# Patient Record
Sex: Female | Born: 1960 | Race: White | Hispanic: No | Marital: Married | Smoking: Never smoker
Health system: Southern US, Community
[De-identification: ages and names within clinical notes are randomized; demographics above are authoritative.]

## PROBLEM LIST (undated history)

## (undated) DIAGNOSIS — C439 Malignant melanoma of skin, unspecified: Secondary | ICD-10-CM

## (undated) DIAGNOSIS — F329 Major depressive disorder, single episode, unspecified: Secondary | ICD-10-CM

## (undated) DIAGNOSIS — F32A Depression, unspecified: Secondary | ICD-10-CM

## (undated) HISTORY — DX: Depression, unspecified: F32.A

## (undated) HISTORY — PX: ABDOMINAL HYSTERECTOMY: SHX81

## (undated) HISTORY — DX: Major depressive disorder, single episode, unspecified: F32.9

## (undated) HISTORY — DX: Malignant melanoma of skin, unspecified: C43.9

---

## 2000-03-03 ENCOUNTER — Ambulatory Visit (HOSPITAL_BASED_OUTPATIENT_CLINIC_OR_DEPARTMENT_OTHER): Admission: RE | Admit: 2000-03-03 | Discharge: 2000-03-03 | Payer: Self-pay | Admitting: General Surgery

## 2001-02-18 ENCOUNTER — Encounter: Admission: RE | Admit: 2001-02-18 | Discharge: 2001-02-18 | Payer: Self-pay | Admitting: Dermatology

## 2012-05-03 ENCOUNTER — Telehealth: Payer: Self-pay | Admitting: Nurse Practitioner

## 2012-05-03 NOTE — Telephone Encounter (Signed)
Appt given

## 2012-05-05 ENCOUNTER — Encounter: Payer: Self-pay | Admitting: Family Medicine

## 2012-05-05 ENCOUNTER — Ambulatory Visit (INDEPENDENT_AMBULATORY_CARE_PROVIDER_SITE_OTHER): Payer: BC Managed Care – PPO | Admitting: Family Medicine

## 2012-05-05 VITALS — BP 113/74 | HR 67 | Temp 97.2°F | Ht 64.0 in | Wt 171.0 lb

## 2012-05-05 DIAGNOSIS — M7711 Lateral epicondylitis, right elbow: Secondary | ICD-10-CM

## 2012-05-05 DIAGNOSIS — M7710 Lateral epicondylitis, unspecified elbow: Secondary | ICD-10-CM

## 2012-05-05 NOTE — Progress Notes (Signed)
Patient ID: Rachel Page, female   DOB: February 19, 1960, 52 y.o.   MRN: [id number] SUBJECTIVE: HPI: Right elbow pain for 3 weeks. 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 Repetitive work at [employer] with her right arm. No paresthesias.   PMH/PSH: reviewed/updated in Epic  SH/FH: reviewed/updated in Epic  Allergies: reviewed/updated in Epic  Medications: reviewed/updated in Epic  Immunizations: reviewed/updated in Epic  ROS: As above in the HPI. All other systems are stable or negative.  OBJECTIVE: APPEARANCE: WF Patient in no acute distress.The patient appeared well nourished and normally developed. Acyanotic. Waist: VITAL SIGNS:BP 113/74  Pulse 67  Temp(Src) 97.2 F (36.2 C) (Oral)  Ht 5\' 4"  (1.626 m)  Wt 171 lb (77.565 kg)  BMI 29.34 kg/m2  LMP 05/05/1992   SKIN: warm and  Dry without overt rashes, tattoos and scars  HEAD and Neck: without JVD, Head and scalp: normal Eyes:No scleral icterus. Fundi normal, eye movements normal. Ears: Auricle normal, canal normal, Tympanic membranes normal, insufflation normal. Nose: normal Throat: normal Neck & thyroid: normal  EXTREMETIES: nonedematous.  MUSCULOSKELETAL:  Spine: normal Joints: intact. Right lateral epicondyle very tender to palpation. Pain on pronation and supination.  NEUROLOGIC: oriented to time,place and person; nonfocal.  ASSESSMENT: Tennis elbow syndrome, right  PLAN: No orders of the defined types were placed in this encounter.   No results found for this or any previous visit (from the past 24 hour(s)). Meds ordered this encounter  Medications  . fish oil-omega-3 fatty acids 1000 MG capsule    Sig: Take 2 g by mouth daily.  Marland Kitchen aspirin 81 MG tablet    Sig: Take 81 mg by mouth daily.  . Multiple Vitamin (MULTIVITAMIN) tablet    Sig: Take 1 tablet by mouth daily.  . Red Yeast Rice Extract (RED YEAST RICE PO)    Sig: Take by mouth.  . calcium citrate-vitamin D (CITRACAL+D)  315-200 MG-UNIT per tablet    Sig: Take 1 tablet by mouth 2 (two) times daily.  Marland Kitchen estrogens, conjugated, (PREMARIN) 0.3 MG tablet    Sig: Take 0.3 mg by mouth 2 (two) times a week. Take daily for 21 days then do not take for 7 days.   Trigger Point Injection/Right Lateral Epicondylar area injection.  Pre-operative diagnosis: Right lateral Epicondylitis  Post-operative diagnosis: Right lateral epicondylitis  After risks and benefits were explained including bleeding, infection, worsening of the pain, damage to the area being injected, weakness, allergic reaction to medications, vascular injection, and nerve damage, signed consent was obtained.  All questions were answered.    The area of the trigger point was identified over the lateral epicondyle and the skin prepped with betadine and with alcohol and the alcohol allowed to dry.  Next, a 25 gauge 1.5 inch needle was placed in the area of the trigger point. The skin was pinched outward to ensure deep insertion of steroid.  Once reproduction of the pain was elicited and negative aspiration confirmed, the trigger point was injected and the needle removed.    The patient did tolerate the procedure well and there were not complications.    Medication used: 0.5 cc of Kenalog40; 1 mL 1% Lidocaine.    Trigger points injected: 1    Trigger point(s) location(s):  right epicondyle  Call if any problems. A tennis elbow splint was applied. Ice every 2 hours. Rest elbow at work. RTC prn or call if not resolved in 4 weeks. Consider Orthopedics consultation if relapse  or not resolved.   Zebulon Gantt P. Modesto Charon, M.D.

## 2012-05-05 NOTE — Patient Instructions (Addendum)
Ice every 2 hours.  Wear the tennis elbow strap.  Call for orthopedic referral if not resolved in the next 4 weeks.  Tennis Elbow Your caregiver has diagnosed you with a condition often referred to as "tennis elbow." This results from small tears or soreness (inflammation) at the start (origin) of the extensor muscles of the forearm. Although the condition is often called tennis or golfer's elbow, it is caused by any repetitive action performed by your elbow. HOME CARE INSTRUCTIONS  If the condition has been short lived, rest may be the only treatment required. Using your opposite hand or arm to perform the task may help. Even changing your grip may help rest the extremity. These may even prevent the condition from recurring.  Longer standing problems, however, will often be relieved faster by:  Using anti-inflammatory agents.  Applying ice packs for 30 minutes at the end of the working day, at bed time, or when activities are finished.  Your caregiver may also have you wear a splint or sling. This will allow the inflamed tendon to heal. At times, steroid injections aided with a local anesthetic will be required along with splinting for 1 to 2 weeks. Two to three steroid injections will often solve the problem. In some long standing cases, the inflamed tendon does not respond to conservative (non-surgical) therapy. Then surgery may be required to repair it. MAKE SURE YOU:   Understand these instructions.  Will watch your condition.  Will get help right away if you are not doing well or get worse. Document Released: 12/30/2004 Document Revised: 03/24/2011 Document Reviewed: 08/18/2007 [facility] Patient Information 2013 [place], [place].

## 2012-10-08 ENCOUNTER — Telehealth: Payer: Self-pay | Admitting: Family Medicine

## 2012-10-08 NOTE — Telephone Encounter (Signed)
Pt having back pain, started at work yesterday after bending down Pt notified to contact [facility] and have them to contact us for appt

## 2012-10-08 NOTE — Telephone Encounter (Signed)
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

## 2012-12-16 ENCOUNTER — Ambulatory Visit (INDEPENDENT_AMBULATORY_CARE_PROVIDER_SITE_OTHER): Payer: BC Managed Care – PPO | Admitting: Family Medicine

## 2012-12-16 ENCOUNTER — Encounter: Payer: Self-pay | Admitting: Family Medicine

## 2012-12-16 VITALS — BP 135/89 | HR 89 | Temp 101.2°F | Ht 64.0 in | Wt 168.6 lb

## 2012-12-16 DIAGNOSIS — J029 Acute pharyngitis, unspecified: Secondary | ICD-10-CM

## 2012-12-16 LAB — POCT RAPID STREP A (OFFICE): Rapid Strep A Screen: POSITIVE — AB

## 2012-12-16 MED ORDER — AZITHROMYCIN 250 MG PO TABS: ORAL_TABLET | ORAL | Status: DC

## 2012-12-16 NOTE — Patient Instructions (Signed)

## 2012-12-16 NOTE — Progress Notes (Signed)
   Subjective:    Patient ID: Rachel Page, female    DOB: 02/06/1960, 52 y.o.   MRN: [id number]  HPI This 52 y.o. female presents for evaluation of sore throat and fever.   Review of Systems C/o sore throat and fever   No chest pain, SOB, HA, dizziness, vision change, N/V, diarrhea, constipation, dysuria, urinary urgency or frequency, myalgias, arthralgias or rash.  Objective:   Physical Exam  Vital signs noted  Well developed well nourished female.  HEENT - Head atraumatic Normocephalic                Eyes - PERRLA, Conjuctiva - clear Sclera- Clear EOMI                Ears - EAC's Wnl TM's Wnl Gross Hearing WNL                Throat - oropharanx injected Respiratory - Lungs CTA bilateral Cardiac - RRR S1 and S2 without murmur GI - Abdomen soft Nontender and bowel sounds active x 4 Extremities - No edema. Neuro - Grossly intact.  Results for orders placed in visit on 12/16/12  POCT RAPID STREP A (OFFICE)      Result Value Range   Rapid Strep A Screen Positive (*) Negative      Assessment & Plan:  Sore throat - Plan: POCT rapid strep A, azithromycin (ZITHROMAX) 250 MG tablet  Acute pharyngitis - Plan: azithromycin (ZITHROMAX) 250 MG tablet  WSWG's prn Push po fluids, rest, tylenol and motrin otc prn as directed for fever, arthralgias, and myalgias.  Follow up prn if sx's continue or persist.  Deatra Canter FNP

## 2013-10-28 ENCOUNTER — Ambulatory Visit (INDEPENDENT_AMBULATORY_CARE_PROVIDER_SITE_OTHER): Payer: BC Managed Care – PPO | Admitting: Family Medicine

## 2013-10-28 ENCOUNTER — Encounter: Payer: Self-pay | Admitting: Family Medicine

## 2013-10-28 ENCOUNTER — Encounter: Payer: Self-pay | Admitting: *Deleted

## 2013-10-28 VITALS — BP 136/88 | HR 100 | Temp 100.3°F | Ht 64.0 in | Wt 168.0 lb

## 2013-10-28 DIAGNOSIS — J029 Acute pharyngitis, unspecified: Secondary | ICD-10-CM

## 2013-10-28 LAB — POCT RAPID STREP A (OFFICE): Rapid Strep A Screen: NEGATIVE

## 2013-10-28 MED ORDER — ERYTHROMYCIN ETHYLSUCCINATE 400 MG PO TABS: 400.0000 mg | ORAL_TABLET | Freq: Four times a day (QID) | ORAL | Status: DC

## 2013-10-28 NOTE — Progress Notes (Signed)
   Subjective:    Patient ID: LOY LITTLE, female    DOB: [birth year], 53 y.o.   MRN: [id number]  HPI This 53 y.o. female presents for evaluation of sore throat.   Review of Systems No chest pain, SOB, HA, dizziness, vision change, N/V, diarrhea, constipation, dysuria, urinary urgency or frequency, myalgias, arthralgias or rash.     Objective:   Physical Exam Vital signs noted  Well developed well nourished female.  HEENT - Head atraumatic Normocephalic                Eyes - PERRLA, Conjuctiva - clear Sclera- Clear EOMI                Ears - EAC's Wnl TM's Wnl Gross Hearing WNL                Nose - Nares patent                 Throat - oropharanx injected Respiratory - Lungs CTA bilateral Cardiac - RRR S1 and S2 without murmur GI - Abdomen soft Nontender and bowel sounds active x 4 Extremities - No edema. Neuro - Grossly intact.       Assessment & Plan:  Acute pharyngitis, unspecified pharyngitis type - Plan: POCT rapid strep A, erythromycin ethylsuccinate (E.E.S. 400) 400 MG tablet  Lysbeth Penner FNP

## 2013-11-09 ENCOUNTER — Other Ambulatory Visit: Payer: Self-pay | Admitting: Family Medicine

## 2013-11-09 ENCOUNTER — Telehealth: Payer: Self-pay | Admitting: Family Medicine

## 2013-11-09 DIAGNOSIS — J029 Acute pharyngitis, unspecified: Secondary | ICD-10-CM

## 2013-11-09 MED ORDER — AZITHROMYCIN 250 MG PO TABS: ORAL_TABLET | ORAL | Status: DC

## 2013-11-09 NOTE — Addendum Note (Signed)
Addended by: Marin Olp on: 11/09/2013 07:02 PM   Modules accepted: Orders

## 2013-11-09 NOTE — Telephone Encounter (Signed)
Zigmund Daniel can you send in a ENT consult

## 2013-11-09 NOTE — Telephone Encounter (Signed)
Pt notified of referral  

## 2013-11-09 NOTE — Telephone Encounter (Signed)
zpk sent to pharm and what does she want referral for URI?

## 2013-11-09 NOTE — Telephone Encounter (Signed)
Patient keeps getting strep and wants to go to ENT really prefers to go on nov the 9th, 16th.17th,24th,25 those are the days she is off.

## 2015-04-16 DIAGNOSIS — Z6831 Body mass index (BMI) 31.0-31.9, adult: Secondary | ICD-10-CM | POA: Diagnosis not present

## 2015-04-16 DIAGNOSIS — Z1272 Encounter for screening for malignant neoplasm of vagina: Secondary | ICD-10-CM | POA: Diagnosis not present

## 2015-04-16 DIAGNOSIS — Z01419 Encounter for gynecological examination (general) (routine) without abnormal findings: Secondary | ICD-10-CM | POA: Diagnosis not present

## 2015-05-10 DIAGNOSIS — Z85828 Personal history of other malignant neoplasm of skin: Secondary | ICD-10-CM | POA: Diagnosis not present

## 2015-05-10 DIAGNOSIS — Z08 Encounter for follow-up examination after completed treatment for malignant neoplasm: Secondary | ICD-10-CM | POA: Diagnosis not present

## 2015-05-10 DIAGNOSIS — Z1283 Encounter for screening for malignant neoplasm of skin: Secondary | ICD-10-CM | POA: Diagnosis not present

## 2015-08-06 DIAGNOSIS — L723 Sebaceous cyst: Secondary | ICD-10-CM | POA: Diagnosis not present

## 2016-03-20 DIAGNOSIS — Z1231 Encounter for screening mammogram for malignant neoplasm of breast: Secondary | ICD-10-CM | POA: Diagnosis not present

## 2016-04-21 DIAGNOSIS — Z1272 Encounter for screening for malignant neoplasm of vagina: Secondary | ICD-10-CM | POA: Diagnosis not present

## 2016-04-21 DIAGNOSIS — Z01419 Encounter for gynecological examination (general) (routine) without abnormal findings: Secondary | ICD-10-CM | POA: Diagnosis not present

## 2016-04-21 DIAGNOSIS — N952 Postmenopausal atrophic vaginitis: Secondary | ICD-10-CM | POA: Diagnosis not present

## 2016-05-15 DIAGNOSIS — D225 Melanocytic nevi of trunk: Secondary | ICD-10-CM | POA: Diagnosis not present

## 2016-05-15 DIAGNOSIS — D485 Neoplasm of uncertain behavior of skin: Secondary | ICD-10-CM | POA: Diagnosis not present

## 2016-05-15 DIAGNOSIS — L820 Inflamed seborrheic keratosis: Secondary | ICD-10-CM | POA: Diagnosis not present

## 2016-05-15 DIAGNOSIS — Z08 Encounter for follow-up examination after completed treatment for malignant neoplasm: Secondary | ICD-10-CM | POA: Diagnosis not present

## 2016-05-15 DIAGNOSIS — L821 Other seborrheic keratosis: Secondary | ICD-10-CM | POA: Diagnosis not present

## 2016-05-15 DIAGNOSIS — Z85828 Personal history of other malignant neoplasm of skin: Secondary | ICD-10-CM | POA: Diagnosis not present

## 2016-09-03 ENCOUNTER — Ambulatory Visit (INDEPENDENT_AMBULATORY_CARE_PROVIDER_SITE_OTHER): Payer: BLUE CROSS/BLUE SHIELD | Admitting: Family Medicine

## 2016-09-03 ENCOUNTER — Encounter: Payer: Self-pay | Admitting: Family Medicine

## 2016-09-03 VITALS — BP 132/85 | HR 61 | Temp 99.3°F | Ht 64.0 in | Wt 184.0 lb

## 2016-09-03 DIAGNOSIS — M65312 Trigger thumb, left thumb: Secondary | ICD-10-CM

## 2016-09-03 MED ORDER — METHYLPREDNISOLONE ACETATE 80 MG/ML IJ SUSP
40.0000 mg | Freq: Once | INTRAMUSCULAR | Status: AC
  Administered 2016-09-03: 40 mg via INTRAMUSCULAR

## 2016-09-03 NOTE — Progress Notes (Signed)
BP 132/85   Pulse 61   Temp 99.3 F (37.4 C) (Oral)   Ht 5\' 4"  (1.626 m)   Wt 184 lb (83.5 kg)   LMP 05/05/1992   BMI 31.58 kg/m    Subjective:    Patient ID: Rachel Page, female    DOB: [birth year], 56 y.o.   MRN: [id number]  HPI: Rachel Page is a 56 y.o. female presenting on 09/03/2016 for Thumb pain (left thumb, ongoing for several months, uses hands repetively daily)   HPI Left thumb pain Patient comes in complaining of left thumb pain that's been going on for the past several months. She does complain of anoxic and pain near the base of her thumb on her hand and she feels like that thumb does get caught in a flexed position sometimes and then she has to pull it to get it out of that. She does have some swelling and pain and inflammation at the base of that thumb on the hand as well. She denies any fevers or chills or redness or warmth or skin changes.  Relevant past medical, surgical, family and social history reviewed and updated as indicated. Interim medical history since our last visit reviewed. Allergies and medications reviewed and updated.  Review of Systems  Constitutional: Negative for chills and fever.  Respiratory: Negative for chest tightness and shortness of breath.   Cardiovascular: Negative for chest pain and leg swelling.  Musculoskeletal: Positive for arthralgias. Negative for back pain and gait problem.  Skin: Negative for color change and rash.  Psychiatric/Behavioral: Negative for agitation and behavioral problems.  All other systems reviewed and are negative.   Per HPI unless specifically indicated above        Objective:    BP 132/85   Pulse 61   Temp 99.3 F (37.4 C) (Oral)   Ht 5\' 4"  (1.626 m)   Wt 184 lb (83.5 kg)   LMP 05/05/1992   BMI 31.58 kg/m   Wt Readings from Last 3 Encounters:  09/03/16 184 lb (83.5 kg)  10/28/13 168 lb (76.2 kg)  12/16/12 168 lb 9.6 oz (76.5 kg)    Physical Exam  Constitutional: She is oriented  to person, place, and time. She appears well-developed and well-nourished. No distress.  Eyes: Conjunctivae are normal.  Cardiovascular: Normal rate, regular rhythm, normal heart sounds and intact distal pulses.   No murmur heard. Pulmonary/Chest: Effort normal and breath sounds normal. No respiratory distress. She has no wheezes. She has no rales.  Musculoskeletal: Normal range of motion. She exhibits no edema.       Left hand: She exhibits tenderness and swelling. She exhibits no bony tenderness and no deformity. Normal sensation noted. Normal strength noted.       Hands: Neurological: She is alert and oriented to person, place, and time. Coordination normal.  Skin: Skin is warm and dry. No rash noted. She is not diaphoretic.  Psychiatric: She has a normal mood and affect. Her behavior is normal.  Nursing note and vitals reviewed.   Left trigger thumb injection: Consent form signed. Risk factors of bleeding and infection discussed with patient and patient is agreeable towards injection. Patient prepped with Betadine. Anterior approach towards injection used. Injected 40 mg of Depo-Medrol and 0.5 mL of 2% lidocaine. Patient tolerated procedure well and no side effects from noted. Minimal to no bleeding. Simple bandage applied after.     Assessment & Plan:   Problem List Items Addressed This Visit  None    Visit Diagnoses    Trigger thumb of left hand    -  Primary   Relevant Medications   methylPREDNISolone acetate (DEPO-MEDROL) injection 40 mg (Start on 09/03/2016  9:45 AM)       Follow up plan: Return if symptoms worsen or fail to improve.  Counseling provided for all of the vaccine components No orders of the defined types were placed in this encounter.   Caryl Pina, MD [facility] 09/03/2016, 9:42 AM

## 2017-01-08 DIAGNOSIS — Z1283 Encounter for screening for malignant neoplasm of skin: Secondary | ICD-10-CM | POA: Diagnosis not present

## 2017-01-08 DIAGNOSIS — Z85820 Personal history of malignant melanoma of skin: Secondary | ICD-10-CM | POA: Diagnosis not present

## 2017-01-08 DIAGNOSIS — Z08 Encounter for follow-up examination after completed treatment for malignant neoplasm: Secondary | ICD-10-CM | POA: Diagnosis not present

## 2017-02-02 ENCOUNTER — Ambulatory Visit: Payer: BLUE CROSS/BLUE SHIELD | Admitting: Family Medicine

## 2017-02-02 ENCOUNTER — Encounter: Payer: Self-pay | Admitting: Family Medicine

## 2017-02-02 VITALS — BP 136/83 | HR 80 | Temp 97.9°F | Ht 64.0 in | Wt 187.0 lb

## 2017-02-02 DIAGNOSIS — M7712 Lateral epicondylitis, left elbow: Secondary | ICD-10-CM | POA: Diagnosis not present

## 2017-02-02 MED ORDER — METHYLPREDNISOLONE ACETATE 80 MG/ML IJ SUSP
40.0000 mg | Freq: Once | INTRAMUSCULAR | Status: AC
  Administered 2017-02-02: 40 mg via INTRAMUSCULAR

## 2017-02-02 NOTE — Progress Notes (Signed)
BP 136/83   Pulse 80   Temp 97.9 F (36.6 C) (Oral)   Ht 5\' 4"  (1.626 m)   Wt 187 lb (84.8 kg)   LMP 05/05/1992   BMI 32.10 kg/m    Subjective:    Patient ID: Rachel Page, female    DOB: 01-05-1961, 57 y.o.   MRN: [id number]  HPI: Rachel Page is a 57 y.o. female presenting on 02/02/2017 for Left elbow pain (tried heat, ice, and ibuprofen)   HPI Left elbow pain Patient is coming in with today with complaints of left elbow pain that has been bothering her over the past week.  She has been trying heat and ice and ibuprofen to help with this but it does not seem to be helping as much.  She has had this similar issue on her other arm.  She does admit that she recently had a work job change where she is using her hands pulling things a lot more with her arms.  She rates the pain is moderate in intensity and denies radiation anywhere else.  She denies any numbness or weakness or loss of range of motion in the arm.  She says it hurts more with flexion and pronation and grip of the hand with causing the pain in her lateral elbow.  She says that sometimes she does feel like something pops across the bone there on the lateral aspect of her left elbow.  She denies any fevers or chills or redness or warmth.  Relevant past medical, surgical, family and social history reviewed and updated as indicated. Interim medical history since our last visit reviewed. Allergies and medications reviewed and updated.  Review of Systems  Constitutional: Negative for chills and fever.  Eyes: Negative for visual disturbance.  Respiratory: Negative for chest tightness and shortness of breath.   Cardiovascular: Negative for chest pain and leg swelling.  Musculoskeletal: Positive for arthralgias. Negative for back pain, gait problem and joint swelling.  Skin: Negative for rash.  Neurological: Negative for light-headedness and headaches.  Psychiatric/Behavioral: Negative for agitation and behavioral  problems.  All other systems reviewed and are negative.   Per HPI unless specifically indicated above        Objective:    BP 136/83   Pulse 80   Temp 97.9 F (36.6 C) (Oral)   Ht 5\' 4"  (1.626 m)   Wt 187 lb (84.8 kg)   LMP 05/05/1992   BMI 32.10 kg/m   Wt Readings from Last 3 Encounters:  02/02/17 187 lb (84.8 kg)  09/03/16 184 lb (83.5 kg)  10/28/13 168 lb (76.2 kg)    Physical Exam  Constitutional: She is oriented to person, place, and time. She appears well-developed and well-nourished. No distress.  Eyes: Conjunctivae are normal.  Musculoskeletal: Normal range of motion. She exhibits no edema.       Left elbow: She exhibits normal range of motion, no swelling, no effusion and no deformity. Tenderness found. Lateral epicondyle tenderness noted.  Neurological: She is alert and oriented to person, place, and time. Coordination normal.  Skin: Skin is warm and dry. No rash noted. She is not diaphoretic.  Psychiatric: She has a normal mood and affect. Her behavior is normal.  Nursing note and vitals reviewed.   Lateral epicondylitis left injection: Consent form signed. Risk factors of bleeding and infection discussed with patient and patient is agreeable towards injection. Patient prepped with Betadine. Lateral approach towards injection used. Injected 40 mg of Depo-Medrol and 1  mL of 2% lidocaine. Patient tolerated procedure well and no side effects from noted. Minimal to no bleeding. Simple bandage applied after.     Assessment & Plan:   Problem List Items Addressed This Visit    None    Visit Diagnoses    Left tennis elbow    -  Primary   Will try steroid injection, has a few days off work which will be good for rest.   Relevant Medications   methylPREDNISolone acetate (DEPO-MEDROL) injection 40 mg (Completed)       Follow up plan: Return if symptoms worsen or fail to improve.  Counseling provided for all of the vaccine components No orders of the defined  types were placed in this encounter.   Caryl Pina, MD [facility] 02/02/2017, 4:50 PM

## 2017-03-02 ENCOUNTER — Ambulatory Visit: Payer: BLUE CROSS/BLUE SHIELD | Admitting: Family Medicine

## 2017-03-02 ENCOUNTER — Encounter: Payer: Self-pay | Admitting: Family Medicine

## 2017-03-02 VITALS — BP 164/101 | HR 91 | Temp 101.5°F | Ht 64.0 in | Wt 185.0 lb

## 2017-03-02 DIAGNOSIS — R030 Elevated blood-pressure reading, without diagnosis of hypertension: Secondary | ICD-10-CM

## 2017-03-02 DIAGNOSIS — J101 Influenza due to other identified influenza virus with other respiratory manifestations: Secondary | ICD-10-CM | POA: Diagnosis not present

## 2017-03-02 LAB — VERITOR FLU A/B WAIVED
INFLUENZA A: POSITIVE — AB
Influenza B: NEGATIVE

## 2017-03-02 MED ORDER — OSELTAMIVIR PHOSPHATE 75 MG PO CAPS: 75.0000 mg | ORAL_CAPSULE | Freq: Two times a day (BID) | ORAL | 0 refills | Status: AC

## 2017-03-02 MED ORDER — BENZONATATE 200 MG PO CAPS: 200.0000 mg | ORAL_CAPSULE | Freq: Two times a day (BID) | ORAL | 0 refills | Status: DC | PRN

## 2017-03-02 MED ORDER — ONDANSETRON HCL 4 MG PO TABS: 4.0000 mg | ORAL_TABLET | Freq: Three times a day (TID) | ORAL | 0 refills | Status: DC | PRN

## 2017-03-02 NOTE — Progress Notes (Signed)
Subjective: CC: ?Flu PCP: Dettinger, Fransisca Kaufmann, MD PIR:JJOACZY Rachel Page is a 57 y.o. female presenting to clinic today for:  1. Flu like symptoms  Patient reports myalgia, cough, headache, chills, nausea, decreased appetite and fatigue that started yesterday.  She reports that a coworker was sick recently.  Denies hemoptysis, shortness of breath, dizziness, rash, vomiting, diarrhea, recent travel.  Patient has used NyQuil with little relief of symptoms.  Denies history of COPD or asthma.  no tobacco use/ exposure.  2.  Elevated blood pressure Patient reports that she actually recently had a physical at work and her blood pressure was 120s over 70s.  She has never had high blood pressure in her life.  She reports OTC cough and cold medication use as above.  Denies chest pain, shortness of breath, dizziness, blurry vision, weakness or numbness and tingling anywhere.   ROS: Per HPI  Allergies  Allergen Reactions  . Penicillins   . Sulfa Antibiotics    Past Medical History:  Diagnosis Date  . Depression   . Melanoma ([place])    Removed place from back    Current Outpatient Medications:  .  aspirin 81 MG tablet, Take 81 mg by mouth daily., Disp: , Rfl:  .  conjugated estrogens (PREMARIN) vaginal cream, Place 1 Applicatorful vaginally daily., Disp: , Rfl:  .  fish oil-omega-3 fatty acids 1000 MG capsule, Take 2 g by mouth daily., Disp: , Rfl:  .  Multiple Vitamin (MULTIVITAMIN) tablet, Take 1 tablet by mouth daily., Disp: , Rfl:  .  Red Yeast Rice Extract (RED YEAST RICE PO), Take by mouth., Disp: , Rfl:  Social History   Socioeconomic History  . Marital status: Married    Spouse name: Not on file  . Number of children: Not on file  . Years of education: Not on file  . Highest education level: Not on file  Social Needs  . Financial resource strain: Not on file  . Food insecurity - worry: Not on file  . Food insecurity - inability: Not on file  . Transportation needs - medical:  Not on file  . Transportation needs - non-medical: Not on file  Occupational History  . Not on file  Tobacco Use  . Smoking status: Never Smoker  . Smokeless tobacco: Never Used  Substance and Sexual Activity  . Alcohol use: Not on file  . Drug use: Not on file  . Sexual activity: Not on file  Other Topics Concern  . Not on file  Social History Narrative  . Not on file   No family history on file.  Objective: Office vital signs reviewed. BP (!) 164/101   Pulse 91   Temp (!) 101.5 F (38.6 C) (Oral)   Ht 5\' 4"  (1.626 m)   Wt 185 lb (83.9 kg)   LMP 05/05/1992   BMI 31.76 kg/m   Physical Examination:  General: Awake, alert, nontoxic, No acute distress HEENT: Normal    Neck: No masses palpated. No lymphadenopathy    Ears: Tympanic membranes intact, normal light reflex, no erythema, no bulging    Eyes: PERRLA, extraocular membranes intact, sclera white    Nose: nasal turbinates moist, clear nasal discharge    Throat: moist mucus membranes, no erythema, tonsils surgically absent.  Airway is patent Cardio: regular rate and rhythm, S1S2 heard, no murmurs appreciated Pulm: clear to auscultation bilaterally, no wheezes, rhonchi or rales; normal work of breathing on room air Neuro: No focal neurologic deficits.  Alert and oriented  x3.  Follows all commands.  Assessment/ Plan: 57 y.o. female   1. Influenza A Patient is febrile to 101.5 F here in office.  Blood pressure is elevated.  She is nontoxic but does appear ill.  Rapid flu obtained and was positive for influenza A.  Tamiflu 75 mg p.o. twice daily for 5 days prescribed.  Tessalon Perles 200 mg p.o. twice daily as needed cough.  Zofran ODT 4 mg every 8 hours as needed nausea or vomiting push oral fluids.  Supportive care.  Handout provided.  Work note provided excusing for the next week. - Veritor Flu A/B Waived  2. Elevated blood pressure reading in office without diagnosis of hypertension Patient is asymptomatic.  This  may be related to current infection with influenza and concomitant use of OTC cough and cold medications.  I did recommend that she discontinue over-the-counter medications not indicated for high blood pressure.  She is to return for repeat blood pressure check in 1 week. Strict return precautions and reasons for emergent evaluation in the emergency department review with patient.  They voiced understanding and will follow-up as directed.   Orders Placed This Encounter  Procedures  . Veritor Flu A/B Waived    Order Specific Question:   Source    Answer:   nasal   Meds ordered this encounter  Medications  . oseltamivir (TAMIFLU) 75 MG capsule    Sig: Take 1 capsule (75 mg total) by mouth 2 (two) times daily for 5 days.    Dispense:  10 capsule    Refill:  0  . benzonatate (TESSALON) 200 MG capsule    Sig: Take 1 capsule (200 mg total) by mouth 2 (two) times daily as needed for cough.    Dispense:  20 capsule    Refill:  0  . ondansetron (ZOFRAN) 4 MG tablet    Sig: Take 1 tablet (4 mg total) by mouth every 8 (eight) hours as needed for nausea or vomiting.    Dispense:  20 tablet    Refill:  [place], DO [place] [phone]

## 2017-03-02 NOTE — Patient Instructions (Signed)
You tested positive for flu.  I have prescribed you Tamiflu to take twice a day for the next 5 days.  As we discussed, the most common side effect of this medication is nausea and GI upset.  I have given you Zofran to use every 8 hours as needed for nausea and vomiting.  Caution, this can cause you to be sleepy.  If you develop significant nausea and vomiting and are unable to stay hydrated, please seek immediate medical attention in the emergency department.  I have also sent you in a cough medication to take twice a day if needed for cough.  Because your blood pressure is elevated, I would avoid over-the-counter medications for cough and cold that or not intended for folks with high blood pressure.  Coricedin over the counter is ok to take if needed.   Influenza, Adult Influenza ("the flu") is an infection in the lungs, nose, and throat (respiratory tract). It is caused by a virus. The flu causes many common cold symptoms, as well as a high fever and body aches. It can make you feel very sick. The flu spreads easily from person to person (is contagious). Getting a flu shot (influenza vaccination) every year is the best way to prevent the flu. Follow these instructions at home:  Take over-the-counter and prescription medicines only as told by your doctor.  Use a cool mist humidifier to add moisture (humidity) to the air in your home. This can make it easier to breathe.  Rest as needed.  Drink enough fluid to keep your pee (urine) clear or pale yellow.  Cover your mouth and nose when you cough or sneeze.  Wash your hands with soap and water often, especially after you cough or sneeze. If you cannot use soap and water, use hand sanitizer.  Stay home from work or school as told by your doctor. Unless you are visiting your doctor, try to avoid leaving home until your fever has been gone for 24 hours without the use of medicine.  Keep all follow-up visits as told by your doctor. This is  important. How is this prevented?  Getting a yearly (annual) flu shot is the best way to avoid getting the flu. You may get the flu shot in late summer, fall, or winter. Ask your doctor when you should get your flu shot.  Wash your hands often or use hand sanitizer often.  Avoid contact with people who are sick during cold and flu season.  Eat healthy foods.  Drink plenty of fluids.  Get enough sleep.  Exercise regularly. Contact a doctor if:  You get new symptoms.  You have: ? Chest pain. ? Watery poop (diarrhea). ? A fever.  Your cough gets worse.  You start to have more mucus.  You feel sick to your stomach (nauseous).  You throw up (vomit). Get help right away if:  You start to be short of breath or have trouble breathing.  Your skin or nails turn a bluish color.  You have very bad pain or stiffness in your neck.  You get a sudden headache.  You get sudden pain in your face or ear.  You cannot stop throwing up. This information is not intended to replace advice given to you by your health care provider. Make sure you discuss any questions you have with your health care provider. Document Released: 10/09/2007 Document Revised: 06/07/2015 Document Reviewed: 10/24/2014 Elsevier Interactive Patient Education  2017 [employer].

## 2017-03-23 DIAGNOSIS — Z1231 Encounter for screening mammogram for malignant neoplasm of breast: Secondary | ICD-10-CM | POA: Diagnosis not present

## 2017-06-16 DIAGNOSIS — Z01419 Encounter for gynecological examination (general) (routine) without abnormal findings: Secondary | ICD-10-CM | POA: Diagnosis not present

## 2018-01-21 DIAGNOSIS — Z1283 Encounter for screening for malignant neoplasm of skin: Secondary | ICD-10-CM | POA: Diagnosis not present

## 2018-01-21 DIAGNOSIS — Z08 Encounter for follow-up examination after completed treatment for malignant neoplasm: Secondary | ICD-10-CM | POA: Diagnosis not present

## 2018-01-21 DIAGNOSIS — Z85820 Personal history of malignant melanoma of skin: Secondary | ICD-10-CM | POA: Diagnosis not present

## 2018-01-21 DIAGNOSIS — L821 Other seborrheic keratosis: Secondary | ICD-10-CM | POA: Diagnosis not present

## 2018-03-25 DIAGNOSIS — Z1231 Encounter for screening mammogram for malignant neoplasm of breast: Secondary | ICD-10-CM | POA: Diagnosis not present

## 2018-08-16 DIAGNOSIS — Z01419 Encounter for gynecological examination (general) (routine) without abnormal findings: Secondary | ICD-10-CM | POA: Diagnosis not present

## 2018-08-16 DIAGNOSIS — Z6834 Body mass index (BMI) 34.0-34.9, adult: Secondary | ICD-10-CM | POA: Diagnosis not present

## 2018-10-04 ENCOUNTER — Ambulatory Visit: Payer: BC Managed Care – PPO | Admitting: Family Medicine

## 2018-10-04 ENCOUNTER — Encounter: Payer: Self-pay | Admitting: Family Medicine

## 2018-10-04 ENCOUNTER — Ambulatory Visit (INDEPENDENT_AMBULATORY_CARE_PROVIDER_SITE_OTHER): Payer: BC Managed Care – PPO

## 2018-10-04 ENCOUNTER — Other Ambulatory Visit: Payer: Self-pay

## 2018-10-04 VITALS — BP 160/96 | HR 93 | Temp 96.6°F | Resp 18 | Ht 64.0 in | Wt 194.6 lb

## 2018-10-04 DIAGNOSIS — M545 Low back pain, unspecified: Secondary | ICD-10-CM

## 2018-10-04 LAB — URINALYSIS, COMPLETE
Bilirubin, UA: NEGATIVE
Glucose, UA: NEGATIVE
Ketones, UA: NEGATIVE
Leukocytes,UA: NEGATIVE
Nitrite, UA: NEGATIVE
Protein,UA: NEGATIVE
RBC, UA: NEGATIVE
Specific Gravity, UA: 1.01 (ref 1.005–1.030)
Urobilinogen, Ur: 0.2 mg/dL (ref 0.2–1.0)
pH, UA: 6 (ref 5.0–7.5)

## 2018-10-04 LAB — MICROSCOPIC EXAMINATION
Bacteria, UA: NONE SEEN
RBC, Urine: NONE SEEN /hpf (ref 0–2)
Renal Epithel, UA: NONE SEEN /hpf

## 2018-10-04 MED ORDER — CYCLOBENZAPRINE HCL 10 MG PO TABS: 10.0000 mg | ORAL_TABLET | Freq: Every evening | ORAL | 1 refills | Status: DC | PRN

## 2018-10-04 MED ORDER — PREDNISONE 20 MG PO TABS: ORAL_TABLET | ORAL | 0 refills | Status: DC

## 2018-10-04 NOTE — Progress Notes (Signed)
BP (!) 160/96   Pulse 93   Temp (!) 96.6 F (35.9 C) (Temporal)   Resp 18   Ht 5\' 4"  (1.626 m)   Wt 194 lb 9.6 oz (88.3 kg)   LMP 05/05/1992   SpO2 99%   BMI 33.40 kg/m    Subjective:   Patient ID: Rachel Page, female    DOB: 10-03-1960, 58 y.o.   MRN: [id number]  HPI: Rachel Page is a 58 y.o. female presenting on 10/04/2018 for Back Pain (Patient states it has been ongoing but worse x 1 day)   HPI Patient comes in for low back pain this been hurting over the past day and a half.  She says she is had this off and on before but this is the worst that is been.  She said yesterday it was a lot worse and she is having trouble even standing up and then it gets better at sometimes.  She denies any fevers or chills or shortness of breath or wheezing.  Relevant past medical, surgical, family and social history reviewed and updated as indicated. Interim medical history since our last visit reviewed. Allergies and medications reviewed and updated.  Review of Systems  Constitutional: Negative for chills and fever.  Eyes: Negative for visual disturbance.  Respiratory: Negative for chest tightness and shortness of breath.   Cardiovascular: Negative for chest pain and leg swelling.  Genitourinary: Negative for difficulty urinating, dysuria and hematuria.  Musculoskeletal: Positive for back pain. Negative for arthralgias and gait problem.  Skin: Negative for rash.  Neurological: Negative for light-headedness and headaches.  Psychiatric/Behavioral: Negative for agitation and behavioral problems.  All other systems reviewed and are negative.   Per HPI unless specifically indicated above   Allergies as of 10/04/2018      Reactions   Penicillins    Sulfa Antibiotics       Medication List       Accurate as of October 04, 2018  4:03 PM. If you have any questions, ask your nurse or doctor.        STOP taking these medications   benzonatate 200 MG capsule Commonly known  as: TESSALON Stopped by: Worthy Rancher, MD   ondansetron 4 MG tablet Commonly known as: Zofran Stopped by: Fransisca Kaufmann , MD     TAKE these medications   aspirin 81 MG tablet Take 81 mg by mouth daily.   conjugated estrogens vaginal cream Commonly known as: PREMARIN Place 1 Applicatorful vaginally daily.   fish oil-omega-3 fatty acids 1000 MG capsule Take 2 g by mouth daily.   multivitamin tablet Take 1 tablet by mouth daily.   RED YEAST RICE PO Take by mouth.        Objective:   BP (!) 160/96   Pulse 93   Temp (!) 96.6 F (35.9 C) (Temporal)   Resp 18   Ht 5\' 4"  (1.626 m)   Wt 194 lb 9.6 oz (88.3 kg)   LMP 05/05/1992   SpO2 99%   BMI 33.40 kg/m   Wt Readings from Last 3 Encounters:  10/04/18 194 lb 9.6 oz (88.3 kg)  03/02/17 185 lb (83.9 kg)  02/02/17 187 lb (84.8 kg)    Physical Exam Vitals signs and nursing note reviewed.  Constitutional:      General: She is not in acute distress.    Appearance: She is well-developed. She is not diaphoretic.  Eyes:     Conjunctiva/sclera: Conjunctivae normal.  Cardiovascular:  Rate and Rhythm: Normal rate and regular rhythm.     Heart sounds: Normal heart sounds. No murmur.  Pulmonary:     Effort: Pulmonary effort is normal. No respiratory distress.     Breath sounds: Normal breath sounds. No wheezing.  Musculoskeletal: Normal range of motion.     Lumbar back: She exhibits tenderness (Bilateral lumbar tenderness and midline). She exhibits normal range of motion, no deformity and no spasm.  Skin:    General: Skin is warm and dry.     Findings: No rash.  Neurological:     Mental Status: She is alert and oriented to person, place, and time.     Coordination: Coordination normal.  Psychiatric:        Behavior: Behavior normal.       Assessment & Plan:   Problem List Items Addressed This Visit    None    Visit Diagnoses    Midline low back pain, unspecified chronicity, unspecified whether  sciatica present    -  Primary   Relevant Medications   predniSONE (DELTASONE) 20 MG tablet   cyclobenzaprine (FLEXERIL) 10 MG tablet   Other Relevant Orders   Urinalysis, Complete   DG Lumbar Spine 2-3 Views   Ambulatory referral to Physical Therapy      Will do referral to physical therapy and prednisone muscle relaxer, likely related to muscular strain, she has had it off and on chronically, if not improved may consider MRI in the future. Follow up plan: Return if symptoms worsen or fail to improve.  Counseling provided for all of the vaccine components Orders Placed This Encounter  Procedures  . Urinalysis, Complete    Caryl Pina, MD [facility] 10/04/2018, 4:03 PM

## 2018-10-07 ENCOUNTER — Other Ambulatory Visit: Payer: Self-pay

## 2018-10-07 ENCOUNTER — Encounter: Payer: Self-pay | Admitting: Physical Therapy

## 2018-10-07 ENCOUNTER — Ambulatory Visit: Payer: BC Managed Care – PPO | Attending: Family Medicine | Admitting: Physical Therapy

## 2018-10-07 DIAGNOSIS — M545 Low back pain: Secondary | ICD-10-CM | POA: Diagnosis not present

## 2018-10-07 DIAGNOSIS — M6281 Muscle weakness (generalized): Secondary | ICD-10-CM | POA: Insufficient documentation

## 2018-10-07 DIAGNOSIS — G8929 Other chronic pain: Secondary | ICD-10-CM | POA: Insufficient documentation

## 2018-10-07 NOTE — Therapy (Signed)
[facility] [place], [place], [address] Phone: [phone]   Fax:  [phone]  Physical Therapy Evaluation  Patient Details  Name: Rachel Page MRN: [id number] Date of Birth: 1960/02/19 Referring Provider (PT): Caryl Pina, MD   Encounter Date: 10/07/2018  PT End of Session - 10/07/18 2011    Visit Number  1    Number of Visits  12    Date for PT Re-Evaluation  11/25/18    Authorization Type  FOTO; Progress note every 10th visit    PT Start Time  1430    PT Stop Time  1515    PT Time Calculation (min)  45 min    Activity Tolerance  Patient tolerated treatment well    Behavior During Therapy  [facility] for tasks assessed/performed       Past Medical History:  Diagnosis Date  . Depression   . Melanoma ([place])    Removed place from back    Past Surgical History:  Procedure Laterality Date  . ABDOMINAL HYSTERECTOMY      There were no vitals filed for this visit.   Subjective Assessment - 10/07/18 1956    Subjective  COVID-19 screening performed upon arrival. Patient arrives to physical therapy with a one year history of chronic low back pain that exacerbated after trying to get out of bed on 10/03/2018. Patient reported pain was very sharp and severe that she requires assistance from her husband to get out of bed. Patient reports being prescribed muscle relaxers and prednisone helped alleviate some pain. Patient reports difficulties with work activities due to standing. Patient reports pain at worst is 10/10 and pain at best as 3/10. Patient's goals are to decrease pain, improve strength, and improve ability to perform home and work activities.    Pertinent History  Sulfur allergy    Limitations  Standing;Lifting;House hold activities    How long can you sit comfortably?  unlimited    How long can you stand comfortably?  stands for 8 hrs at work but very uncomfortable    How long can you walk comfortably?   unlimited    Diagnostic tests  x-ray: multilevel spondylosis    Patient Stated Goals  decrease pain, less difficulties with work activities, improve strength    Currently in Pain?  Yes    Pain Score  4     Pain Location  Back    Pain Orientation  Lower    Pain Descriptors / Indicators  Sharp;Sore    Pain Type  Chronic pain    Pain Onset  More than a month ago    Pain Frequency  Constant    Aggravating Factors   standing, bending over    Pain Relieving Factors  "putting cream on it and resting"    Effect of Pain on Daily Activities  minimal after getting up and moving.         [facility] PT Assessment - 10/07/18 0001      Assessment   Medical Diagnosis  Midline low back pain, unspecified chronicity, unspecified whether sciatica is present    Referring Provider (PT)  Caryl Pina, MD    Onset Date/Surgical Date  10/03/18   one year; exacerbation    Next MD Visit  n/a    Prior Therapy  no      Precautions   Precautions  None      Restrictions   Weight Bearing Restrictions  No      Balance Screen  Has the patient fallen in the past 6 months  No    Has the patient had a decrease in activity level because of a fear of falling?   No    Is the patient reluctant to leave their home because of a fear of falling?   No      Home Film/video editor residence      Prior Function   Level of Independence  Independent    Vocation  Full time employment    Vocation Requirements  Standing 8 hours      Observation/Other Assessments   Focus on Therapeutic Outcomes (FOTO)   33% limitation      Posture/Postural Control   Posture/Postural Control  Postural limitations    Postural Limitations  Rounded Shoulders;Forward head;Increased lumbar lordosis;Anterior pelvic tilt      ROM / Strength   AROM / PROM / Strength  AROM;Strength      AROM   AROM Assessment Site  Lumbar    Lumbar Flexion  9" finger tip to floor    Lumbar - Right Side Bend  19.5" finger tip to  floor    Lumbar - Left Side Bend  21" finger tip to floor   (+) pain     Strength   Strength Assessment Site  Hip;Knee    Right/Left Hip  Right;Left    Right Hip Flexion  4/5    Right Hip Extension  4-/5    Right Hip ABduction  4-/5    Left Hip Flexion  4/5    Left Hip Extension  4-/5    Left Hip ABduction  4-/5    Right/Left Knee  Right;Left    Right Knee Flexion  4/5    Right Knee Extension  4/5    Left Knee Flexion  4/5    Left Knee Extension  4/5      Palpation   Palpation comment  Tenderness to palpation to lumbar spinous processess, lumbar paraspinals and QLs and PSIS bilaterally. increeased tone noted in bilateral QLs and lumbar paraspinals      Transfers   Transfers  Independent with all Transfers                Objective measurements completed on examination: See above findings.              PT Education - 10/07/18 2009    Education Details  draw ins, bridges, marching, hip adduction    Person(s) Educated  Patient    Methods  Explanation;Demonstration;Handout    Comprehension  Verbalized understanding;Returned demonstration          PT Long Term Goals - 10/07/18 2017      PT LONG TERM GOAL #1   Title  Patient will be independent with HEP    Time  6    Period  Weeks    Status  New      PT LONG TERM GOAL #2   Title  Patient will demonstrate 4/5 or greater bilateral hip MMT to improve stability during functional tasks.    Time  6    Period  Weeks    Status  New      PT LONG TERM GOAL #3   Title  Patient will report ability to stand for 4+ hours for work activities with low back pain less than or equal to 4/10    Time  4    Period  Weeks    Status  New  PT LONG TERM GOAL #4   Title  Patient will report minimal to no sharp pains in low back while transitioning in/out of bed.    Time  4    Period  Weeks    Status  New             Plan - 10/07/18 2015    Clinical Impression Statement  Patient is a 58 year old female  who presents to physical therapy with low back pain, decreased lumbar ROM, and decreased bilateral MMT that exacerbated on 10/03/2018. Patient noted with increased lumbar lordosis and anterior pelvic tilt. Patient tender to palpation to lumbar spinous processes, bilateral lumbar paraspinals and bilateral QLs. Patient and PT discussed HEP to which patient reported understanding. Patient would benefit from skilled physical therapy to address deficits and address patient's goals.    Personal Factors and Comorbidities  Age    Examination-Activity Limitations  Bend;Carry;Lift;Stand    Stability/Clinical Decision Making  Stable/Uncomplicated    Clinical Decision Making  Low    Rehab Potential  Good    PT Frequency  2x / week    PT Duration  6 weeks    PT Treatment/Interventions  ADLs/Self Care Home Management;Cryotherapy;Electrical Stimulation;Moist Heat;Traction;Ultrasound;Neuromuscular re-education;Manual techniques;Therapeutic exercise;Therapeutic activities;Patient/family education;Passive range of motion;Dry needling;Taping;Spinal Manipulations    PT Next Visit Plan  Nustep, LE strengthening, core strengthening and stabilization, modalities PRN for pain relief.    PT Home Exercise Plan  see patient education section    Consulted and Agree with Plan of Care  Patient       Patient will benefit from skilled therapeutic intervention in order to improve the following deficits and impairments:  Decreased activity tolerance, Decreased strength, Decreased range of motion, Pain, Postural dysfunction  Visit Diagnosis: Chronic midline low back pain, unspecified whether sciatica present  Muscle weakness (generalized)     Problem List Patient Active Problem List   Diagnosis Date Noted  . Elevated blood pressure reading in office without diagnosis of hypertension 03/02/2017    Gabriela Eves, PT, DPT 10/07/2018, 10:03 PM  [facility] [address] [place], [place], [address] Phone: [phone]   Fax:  [phone]  Name: Rachel Page MRN: [id number] Date of Birth: November 08, 1960

## 2018-10-14 ENCOUNTER — Encounter: Payer: Self-pay | Admitting: Physical Therapy

## 2018-10-14 ENCOUNTER — Ambulatory Visit: Payer: BC Managed Care – PPO | Attending: Family Medicine | Admitting: Physical Therapy

## 2018-10-14 ENCOUNTER — Other Ambulatory Visit: Payer: Self-pay

## 2018-10-14 DIAGNOSIS — M545 Low back pain, unspecified: Secondary | ICD-10-CM

## 2018-10-14 DIAGNOSIS — M6281 Muscle weakness (generalized): Secondary | ICD-10-CM | POA: Diagnosis not present

## 2018-10-14 DIAGNOSIS — G8929 Other chronic pain: Secondary | ICD-10-CM | POA: Insufficient documentation

## 2018-10-14 NOTE — Therapy (Signed)
[facility] [place], [place], [address] Phone: [phone]   Fax:  [phone]  Physical Therapy Treatment  Patient Details  Name: Rachel Page MRN: [id number] Date of Birth: 03/26/1960 Referring Provider (PT): Caryl Pina, MD   Encounter Date: 10/14/2018  PT End of Session - 10/14/18 1603    Visit Number  2    Number of Visits  12    Date for PT Re-Evaluation  11/25/18    Authorization Type  FOTO; Progress note every 10th visit    PT Start Time  1513    PT Stop Time  1559    PT Time Calculation (min)  46 min    Activity Tolerance  Patient tolerated treatment well    Behavior During Therapy  [facility] for tasks assessed/performed       Past Medical History:  Diagnosis Date  . Depression   . Melanoma ([place])    Removed place from back    Past Surgical History:  Procedure Laterality Date  . ABDOMINAL HYSTERECTOMY      There were no vitals filed for this visit.  Subjective Assessment - 10/14/18 1557    Subjective  COVID-19 screening performed upon arrival. Patient reports having a bad day after work. Pain is more on the left side than the right.    Pertinent History  Sulfur allergy    Limitations  Standing;Lifting;House hold activities    How long can you sit comfortably?  unlimited    How long can you stand comfortably?  stands for 8 hrs at work but very uncomfortable    How long can you walk comfortably?  unlimited    Diagnostic tests  x-ray: multilevel spondylosis    Patient Stated Goals  decrease pain, less difficulties with work activities, improve strength    Currently in Pain?  Yes    Pain Score  7     Pain Location  Back    Pain Orientation  Lower    Pain Descriptors / Indicators  Sharp;Sore    Pain Type  Chronic pain    Pain Onset  More than a month ago    Pain Frequency  Constant         [facility] PT Assessment - 10/14/18 0001      Assessment   Medical Diagnosis  Midline low back pain,  unspecified chronicity, unspecified whether sciatica is present    Referring Provider (PT)  Caryl Pina, MD    Onset Date/Surgical Date  10/03/18    Next MD Visit  n/a    Prior Therapy  no      Precautions   Precautions  None                   [facility] Adult PT Treatment/Exercise - 10/14/18 0001      Exercises   Exercises  Lumbar      Lumbar Exercises: Aerobic   Nustep  Level 2 x12 minutes      Modalities   Modalities  Electrical Stimulation;Moist Heat;Ultrasound      Moist Heat Therapy   Number Minutes Moist Heat  10 Minutes    Moist Heat Location  Lumbar Spine      Electrical Stimulation   Electrical Stimulation Location  bilateral low back    Electrical Stimulation Action  IFC    Electrical Stimulation Parameters  80-150 hz x10 mins    Electrical Stimulation Goals  Pain      Ultrasound   Ultrasound Location  bilateral lumbar paraspinals    Ultrasound Parameters  combo e-stim/US, 100%,     Ultrasound Goals  Pain                  PT Long Term Goals - 10/07/18 2017      PT LONG TERM GOAL #1   Title  Patient will be independent with HEP    Time  6    Period  Weeks    Status  New      PT LONG TERM GOAL #2   Title  Patient will demonstrate 4/5 or greater bilateral hip MMT to improve stability during functional tasks.    Time  6    Period  Weeks    Status  New      PT LONG TERM GOAL #3   Title  Patient will report ability to stand for 4+ hours for work activities with low back pain less than or equal to 4/10    Time  4    Period  Weeks    Status  New      PT LONG TERM GOAL #4   Title  Patient will report minimal to no sharp pains in low back while transitioning in/out of bed.    Time  4    Period  Weeks    Status  New            Plan - 10/14/18 [id number]    Clinical Impression Statement  Patient was able to tolerate treatment well with no reports of increased pain. Patient noted with increased tone in lumbar paraspinals upon  palpation. Patient responded well to e-stim/US combo and reported a decrease in pain after modalities.    Personal Factors and Comorbidities  Age    Examination-Activity Limitations  Bend;Carry;Lift;Stand    Stability/Clinical Decision Making  Stable/Uncomplicated    Clinical Decision Making  Low    Rehab Potential  Good    PT Frequency  2x / week    PT Duration  6 weeks    PT Treatment/Interventions  ADLs/Self Care Home Management;Cryotherapy;Electrical Stimulation;Moist Heat;Traction;Ultrasound;Neuromuscular re-education;Manual techniques;Therapeutic exercise;Therapeutic activities;Patient/family education;Passive range of motion;Dry needling;Taping;Spinal Manipulations    PT Next Visit Plan  Nustep, LE strengthening, core strengthening and stabilization, modalities PRN for pain relief.    Consulted and Agree with Plan of Care  Patient       Patient will benefit from skilled therapeutic intervention in order to improve the following deficits and impairments:  Decreased activity tolerance, Decreased strength, Decreased range of motion, Pain, Postural dysfunction  Visit Diagnosis: Chronic midline low back pain, unspecified whether sciatica present  Muscle weakness (generalized)     Problem List Patient Active Problem List   Diagnosis Date Noted  . Elevated blood pressure reading in office without diagnosis of hypertension 03/02/2017    Gabriela Eves, PT, DPT 10/14/2018, 4:12 PM  [facility] [address] [place], [place], [address] Phone: [phone]   Fax:  [phone]  Name: Rachel Page MRN: [id number] Date of Birth: [birth year]

## 2018-10-19 ENCOUNTER — Other Ambulatory Visit: Payer: Self-pay

## 2018-10-19 ENCOUNTER — Encounter: Payer: Self-pay | Admitting: Physical Therapy

## 2018-10-19 ENCOUNTER — Ambulatory Visit: Payer: BC Managed Care – PPO | Admitting: Physical Therapy

## 2018-10-19 DIAGNOSIS — M545 Low back pain: Secondary | ICD-10-CM | POA: Diagnosis not present

## 2018-10-19 DIAGNOSIS — M6281 Muscle weakness (generalized): Secondary | ICD-10-CM | POA: Diagnosis not present

## 2018-10-19 DIAGNOSIS — G8929 Other chronic pain: Secondary | ICD-10-CM | POA: Diagnosis not present

## 2018-10-19 NOTE — Therapy (Signed)
[facility] [place], [place], [address] Phone: [phone]   Fax:  [phone]  Physical Therapy Treatment  Patient Details  Name: Rachel Page MRN: [id number] Date of Birth: [birth year] Referring Provider (PT): Caryl Pina, MD   Encounter Date: 10/19/2018  PT End of Session - 10/19/18 1707    Visit Number  3    Number of Visits  12    Date for PT Re-Evaluation  11/25/18    Authorization Type  FOTO; Progress note every 10th visit    PT Start Time  1600    PT Stop Time  1653    PT Time Calculation (min)  53 min    Activity Tolerance  Patient tolerated treatment well    Behavior During Therapy  [facility] for tasks assessed/performed       Past Medical History:  Diagnosis Date  . Depression   . Melanoma ([place])    Removed place from back    Past Surgical History:  Procedure Laterality Date  . ABDOMINAL HYSTERECTOMY      There were no vitals filed for this visit.  Subjective Assessment - 10/19/18 1659    Subjective  COVID-19 screening performed upon arrival. Patient reports feeling good after last session and states she was feeling good yesterday but today with increased pain.    Pertinent History  Sulfur allergy    Limitations  Standing;Lifting;House hold activities    How long can you sit comfortably?  unlimited    How long can you stand comfortably?  stands for 8 hrs at work but very uncomfortable    How long can you walk comfortably?  unlimited    Diagnostic tests  x-ray: multilevel spondylosis    Patient Stated Goals  decrease pain, less difficulties with work activities, improve strength    Currently in Pain?  Yes    Pain Score  4     Pain Location  Back    Pain Orientation  Lower    Pain Descriptors / Indicators  Sharp;Sore    Pain Type  Chronic pain    Pain Onset  More than a month ago    Pain Frequency  Constant         [facility] PT Assessment - 10/19/18 0001      Assessment   Medical Diagnosis   Midline low back pain, unspecified chronicity, unspecified whether sciatica is present    Referring Provider (PT)  Caryl Pina, MD    Onset Date/Surgical Date  10/03/18    Next MD Visit  n/a    Prior Therapy  no      Precautions   Precautions  None                   [facility] Adult PT Treatment/Exercise - 10/19/18 0001      Lumbar Exercises: Aerobic   Nustep  Level 2 x10 minutes      Moist Heat Therapy   Number Minutes Moist Heat  10 Minutes    Moist Heat Location  Lumbar Spine      Electrical Stimulation   Electrical Stimulation Location  bilateral low back    Electrical Stimulation Action  IFC    Electrical Stimulation Parameters  80-150 hz x10 mins    Electrical Stimulation Goals  Pain      Ultrasound   Ultrasound Location  bilateral lumbar paraspinasl    Ultrasound Parameters  100%, 1.5 w/cm2 1 mhz, x10 mins    Ultrasound Goals  Pain  Manual Therapy   Manual Therapy  Soft tissue mobilization    Soft tissue mobilization  STW/M to lumbar paraspinals and upper gluteals to decrease tone and trigger points                  PT Long Term Goals - 10/07/18 2017      PT LONG TERM GOAL #1   Title  Patient will be independent with HEP    Time  6    Period  Weeks    Status  New      PT LONG TERM GOAL #2   Title  Patient will demonstrate 4/5 or greater bilateral hip MMT to improve stability during functional tasks.    Time  6    Period  Weeks    Status  New      PT LONG TERM GOAL #3   Title  Patient will report ability to stand for 4+ hours for work activities with low back pain less than or equal to 4/10    Time  4    Period  Weeks    Status  New      PT LONG TERM GOAL #4   Title  Patient will report minimal to no sharp pains in low back while transitioning in/out of bed.    Time  4    Period  Weeks    Status  New            Plan - 10/19/18 1707    Clinical Impression Statement  Patient was able to tolerate treatment fairly  well despite ongoing low back pain. Patient noted with multiple trigger points in the lumbar paraspinals with STW/M. Patient and PT discussed plan of care and later incorporating more exercises to address pain. Patient reported understanding. No adverse affects noted upon removal of modalities.    Personal Factors and Comorbidities  Age    Examination-Activity Limitations  Bend;Carry;Lift;Stand    Stability/Clinical Decision Making  Stable/Uncomplicated    Clinical Decision Making  Low    Rehab Potential  Good    PT Frequency  2x / week    PT Duration  6 weeks    PT Treatment/Interventions  ADLs/Self Care Home Management;Cryotherapy;Electrical Stimulation;Moist Heat;Traction;Ultrasound;Neuromuscular re-education;Manual techniques;Therapeutic exercise;Therapeutic activities;Patient/family education;Passive range of motion;Dry needling;Taping;Spinal Manipulations    PT Next Visit Plan  Nustep, LE strengthening, core strengthening and stabilization, modalities PRN for pain relief.    Consulted and Agree with Plan of Care  Patient       Patient will benefit from skilled therapeutic intervention in order to improve the following deficits and impairments:  Decreased activity tolerance, Decreased strength, Decreased range of motion, Pain, Postural dysfunction  Visit Diagnosis: Chronic midline low back pain, unspecified whether sciatica present  Muscle weakness (generalized)     Problem List Patient Active Problem List   Diagnosis Date Noted  . Elevated blood pressure reading in office without diagnosis of hypertension 03/02/2017    Gabriela Eves, PT, DPT 10/19/2018, 5:16 PM  [facility] [address] [place], [place], [address] Phone: [phone]   Fax:  [phone]  Name: Rachel Page MRN: [id number] Date of Birth: October 05, 1960

## 2018-10-21 ENCOUNTER — Encounter: Payer: Self-pay | Admitting: Physical Therapy

## 2018-10-21 ENCOUNTER — Other Ambulatory Visit: Payer: Self-pay

## 2018-10-21 ENCOUNTER — Ambulatory Visit: Payer: BC Managed Care – PPO | Admitting: Physical Therapy

## 2018-10-21 DIAGNOSIS — G8929 Other chronic pain: Secondary | ICD-10-CM | POA: Diagnosis not present

## 2018-10-21 DIAGNOSIS — M6281 Muscle weakness (generalized): Secondary | ICD-10-CM

## 2018-10-21 DIAGNOSIS — M545 Low back pain: Secondary | ICD-10-CM | POA: Diagnosis not present

## 2018-10-21 NOTE — Therapy (Signed)
[facility] [place], [place], [address] Phone: [phone]   Fax:  [phone]  Physical Therapy Treatment  Patient Details  Name: TAWAN ZACCHEO MRN: [id number] Date of Birth: [birth year] Referring Provider (PT): Caryl Pina, MD   Encounter Date: 10/21/2018  PT End of Session - 10/21/18 1520    Visit Number  4    Number of Visits  12    Date for PT Re-Evaluation  11/25/18    Authorization Type  FOTO; Progress note every 10th visit    PT Start Time  1515    PT Stop Time  1608    PT Time Calculation (min)  53 min    Activity Tolerance  Patient tolerated treatment well    Behavior During Therapy  [facility] for tasks assessed/performed       Past Medical History:  Diagnosis Date  . Depression   . Melanoma ([place])    Removed place from back    Past Surgical History:  Procedure Laterality Date  . ABDOMINAL HYSTERECTOMY      There were no vitals filed for this visit.  Subjective Assessment - 10/21/18 1518    Subjective  COVID-19 screening performed upon arrival. "I was feeling alright this morning but then I went to work."    Pertinent History  Sulfur allergy    Limitations  Standing;Lifting;House hold activities    How long can you sit comfortably?  unlimited    How long can you stand comfortably?  stands for 8 hrs at work but very uncomfortable    How long can you walk comfortably?  unlimited    Diagnostic tests  x-ray: multilevel spondylosis    Patient Stated Goals  decrease pain, less difficulties with work activities, improve strength    Currently in Pain?  Yes    Pain Score  4     Pain Location  Back    Pain Orientation  Lower;Medial    Pain Descriptors / Indicators  Sore;Sharp    Pain Type  Chronic pain    Pain Onset  More than a month ago    Pain Frequency  Constant         [facility] PT Assessment - 10/21/18 0001      Assessment   Medical Diagnosis  Midline low back pain, unspecified chronicity,  unspecified whether sciatica is present    Referring Provider (PT)  Caryl Pina, MD    Onset Date/Surgical Date  10/03/18    Next MD Visit  n/a    Prior Therapy  no      Precautions   Precautions  None                   [facility] Adult PT Treatment/Exercise - 10/21/18 0001      Exercises   Exercises  Lumbar      Lumbar Exercises: Stretches   Single Knee to Chest Stretch  2 reps;30 seconds    Double Knee to Chest Stretch  2 reps;30 seconds      Lumbar Exercises: Aerobic   Nustep  Level 3 x10 minutes      Lumbar Exercises: Supine   Pelvic Tilt  15 reps      Modalities   Modalities  Electrical Stimulation;Moist Heat;Ultrasound      Moist Heat Therapy   Number Minutes Moist Heat  10 Minutes    Moist Heat Location  Lumbar Spine      Electrical Stimulation   Electrical Stimulation Location  bilateral  low back    Electrical Stimulation Action  IFC    Electrical Stimulation Parameters  80-150 hz x10 mins    Electrical Stimulation Goals  Pain      Ultrasound   Ultrasound Location  bilateral lumbar paraspinals    Ultrasound Parameters  Combo US/E-stim, 100%, 1.5w/cm2, 1 mhz x10 mins    Ultrasound Goals  Pain                  PT Long Term Goals - 10/07/18 2017      PT LONG TERM GOAL #1   Title  Patient will be independent with HEP    Time  6    Period  Weeks    Status  New      PT LONG TERM GOAL #2   Title  Patient will demonstrate 4/5 or greater bilateral hip MMT to improve stability during functional tasks.    Time  6    Period  Weeks    Status  New      PT LONG TERM GOAL #3   Title  Patient will report ability to stand for 4+ hours for work activities with low back pain less than or equal to 4/10    Time  4    Period  Weeks    Status  New      PT LONG TERM GOAL #4   Title  Patient will report minimal to no sharp pains in low back while transitioning in/out of bed.    Time  4    Period  Weeks    Status  New            Plan -  10/21/18 1622    Clinical Impression Statement  Patient responded well to therapy session but with ongoing pain. Patient reported discomfort with pelvic tilts but was able to complete reps. Patient educated on justification of exercises patient reported understanding. Normal response to modalities upon removal.    Personal Factors and Comorbidities  Age    Examination-Activity Limitations  Bend;Carry;Lift;Stand    Stability/Clinical Decision Making  Stable/Uncomplicated    Clinical Decision Making  Low    Rehab Potential  Good    PT Frequency  2x / week    PT Duration  6 weeks    PT Treatment/Interventions  ADLs/Self Care Home Management;Cryotherapy;Electrical Stimulation;Moist Heat;Traction;Ultrasound;Neuromuscular re-education;Manual techniques;Therapeutic exercise;Therapeutic activities;Patient/family education;Passive range of motion;Dry needling;Taping;Spinal Manipulations    PT Next Visit Plan  Nustep, LE strengthening, core strengthening and stabilization, modalities PRN for pain relief.    Consulted and Agree with Plan of Care  Patient       Patient will benefit from skilled therapeutic intervention in order to improve the following deficits and impairments:  Decreased activity tolerance, Decreased strength, Decreased range of motion, Pain, Postural dysfunction  Visit Diagnosis: Chronic midline low back pain, unspecified whether sciatica present  Muscle weakness (generalized)     Problem List Patient Active Problem List   Diagnosis Date Noted  . Elevated blood pressure reading in office without diagnosis of hypertension 03/02/2017    Gabriela Eves, PT, DPT 10/21/2018, 4:41 PM  [facility] [address] [place], [place], [address] Phone: [phone]   Fax:  [phone]  Name: OBERIA COLICCHIO MRN: [id number] Date of Birth: Nov 06, 1960

## 2018-10-26 ENCOUNTER — Ambulatory Visit: Payer: BC Managed Care – PPO | Admitting: Physical Therapy

## 2018-10-28 ENCOUNTER — Ambulatory Visit: Payer: BC Managed Care – PPO | Admitting: Physical Therapy

## 2018-10-28 ENCOUNTER — Other Ambulatory Visit: Payer: Self-pay

## 2018-10-28 DIAGNOSIS — G8929 Other chronic pain: Secondary | ICD-10-CM

## 2018-10-28 DIAGNOSIS — M545 Low back pain: Secondary | ICD-10-CM

## 2018-10-28 DIAGNOSIS — M6281 Muscle weakness (generalized): Secondary | ICD-10-CM | POA: Diagnosis not present

## 2018-10-28 NOTE — Therapy (Signed)
[facility] [place], [place], [address] Phone: [phone]   Fax:  [phone]  Physical Therapy Treatment  Patient Details  Name: Rachel Page MRN: [id number] Date of Birth: 1961/01/04 Referring Provider (PT): Caryl Pina, MD   Encounter Date: 10/28/2018  PT End of Session - 10/28/18 1603    Visit Number  5    Number of Visits  12    Date for PT Re-Evaluation  11/25/18    Authorization Type  FOTO; Progress note every 10th visit    PT Start Time  1600    PT Stop Time  1658    PT Time Calculation (min)  58 min    Activity Tolerance  Patient tolerated treatment well    Behavior During Therapy  [facility] for tasks assessed/performed       Past Medical History:  Diagnosis Date  . Depression   . Melanoma ([place])    Removed place from back    Past Surgical History:  Procedure Laterality Date  . ABDOMINAL HYSTERECTOMY      There were no vitals filed for this visit.      [facility] PT Assessment - 10/28/18 0001      Assessment   Medical Diagnosis  Midline low back pain, unspecified chronicity, unspecified whether sciatica is present    Referring Provider (PT)  Caryl Pina, MD    Onset Date/Surgical Date  10/03/18    Next MD Visit  n/a    Prior Therapy  no      Precautions   Precautions  None                   [facility] Adult PT Treatment/Exercise - 10/28/18 0001      Exercises   Exercises  Lumbar      Lumbar Exercises: Aerobic   Nustep  Level 4 x12 minutes      Modalities   Modalities  Traction;Electrical Stimulation;Moist Heat      Moist Heat Therapy   Number Minutes Moist Heat  10 Minutes    Moist Heat Location  Lumbar Spine      Electrical Stimulation   Electrical Stimulation Location  bilateral low back    Electrical Stimulation Action  IFC    Electrical Stimulation Parameters  80-150 hz x10 mins    Electrical Stimulation Goals  Pain      Traction   Type of Traction  Lumbar     Min (lbs)  5    Max (lbs)  70    Hold Time  99    Rest Time  5    Time  15                  PT Long Term Goals - 10/07/18 2017      PT LONG TERM GOAL #1   Title  Patient will be independent with HEP    Time  6    Period  Weeks    Status  New      PT LONG TERM GOAL #2   Title  Patient will demonstrate 4/5 or greater bilateral hip MMT to improve stability during functional tasks.    Time  6    Period  Weeks    Status  New      PT LONG TERM GOAL #3   Title  Patient will report ability to stand for 4+ hours for work activities with low back pain less than or equal to 4/10  Time  4    Period  Weeks    Status  New      PT LONG TERM GOAL #4   Title  Patient will report minimal to no sharp pains in low back while transitioning in/out of bed.    Time  4    Period  Weeks    Status  New            Plan - 10/28/18 1633    Clinical Impression Statement  Patient responded well to the initiation of mechanical traction at 70# max pull weight. Patient educated on use of traction and patient reported understanding. Patient denied pain or discomfort upon removal of modalities. Patient instructed to monitor pain and symptoms and report next visit. Patient reported understanding.    Personal Factors and Comorbidities  Age    Examination-Activity Limitations  Bend;Carry;Lift;Stand    Stability/Clinical Decision Making  Stable/Uncomplicated    Clinical Decision Making  Low    Rehab Potential  Good    PT Frequency  2x / week    PT Duration  6 weeks    PT Treatment/Interventions  ADLs/Self Care Home Management;Cryotherapy;Electrical Stimulation;Moist Heat;Traction;Ultrasound;Neuromuscular re-education;Manual techniques;Therapeutic exercise;Therapeutic activities;Patient/family education;Passive range of motion;Dry needling;Taping;Spinal Manipulations    PT Next Visit Plan  Assess mechanical traction, continue pending response, Nustep, LE strengthening, core strengthening and  stabilization, modalities PRN for pain relief.    PT Home Exercise Plan  see patient education section    Consulted and Agree with Plan of Care  Patient       Patient will benefit from skilled therapeutic intervention in order to improve the following deficits and impairments:  Decreased activity tolerance, Decreased strength, Decreased range of motion, Pain, Postural dysfunction  Visit Diagnosis: Chronic midline low back pain, unspecified whether sciatica present  Muscle weakness (generalized)     Problem List Patient Active Problem List   Diagnosis Date Noted  . Elevated blood pressure reading in office without diagnosis of hypertension 03/02/2017    Gabriela Eves, PT, DPT 10/28/2018, 5:27 PM  [facility] [address] [place], [place], [address] Phone: [phone]   Fax:  [phone]  Name: Rachel Page MRN: [id number] Date of Birth: 1960-01-16

## 2018-11-02 ENCOUNTER — Other Ambulatory Visit: Payer: Self-pay

## 2018-11-02 ENCOUNTER — Ambulatory Visit: Payer: BC Managed Care – PPO | Admitting: Physical Therapy

## 2018-11-02 ENCOUNTER — Encounter: Payer: Self-pay | Admitting: Physical Therapy

## 2018-11-02 DIAGNOSIS — G8929 Other chronic pain: Secondary | ICD-10-CM | POA: Diagnosis not present

## 2018-11-02 DIAGNOSIS — M6281 Muscle weakness (generalized): Secondary | ICD-10-CM

## 2018-11-02 DIAGNOSIS — M545 Low back pain: Secondary | ICD-10-CM

## 2018-11-02 NOTE — Therapy (Signed)
[facility] [place], [place], [address] Phone: [phone]   Fax:  [phone]  Physical Therapy Treatment  Patient Details  Name: Rachel Page MRN: [id number] Date of Birth: 1960/07/22 Referring Provider (PT): Caryl Pina, MD   Encounter Date: 11/02/2018  PT End of Session - 11/02/18 1528    Visit Number  6    Number of Visits  12    Date for PT Re-Evaluation  11/25/18    Authorization Type  FOTO; Progress note every 10th visit    PT Start Time  1518    PT Stop Time  1612    PT Time Calculation (min)  54 min    Activity Tolerance  Patient tolerated treatment well    Behavior During Therapy  [facility] for tasks assessed/performed       Past Medical History:  Diagnosis Date  . Depression   . 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 ABDOMINAL HYSTERECTOMY      There were no vitals filed for this visit.  Subjective Assessment - 11/02/18 1516    Subjective  COVID-19 screening performed upon arrival. Reports that she was doing good until working with prolonged standing. Reports waking with pain but work makes pain worse.    Pertinent History  Sulfur allergy    Limitations  Standing;Lifting;House hold activities    How long can you sit comfortably?  unlimited    How long can you stand comfortably?  stands for 8 hrs at work but very uncomfortable    How long can you walk comfortably?  unlimited    Diagnostic tests  x-ray: multilevel spondylosis    Patient Stated Goals  decrease pain, less difficulties with work activities, improve strength    Currently in Pain?  Yes    Pain Score  3     Pain Location  Back    Pain Orientation  Lower    Pain Descriptors / Indicators  Discomfort    Pain Type  Chronic pain    Pain Onset  More than a month ago    Pain Frequency  Constant         [facility] PT Assessment - 11/02/18 0001      Assessment   Medical  Diagnosis  Midline low back pain, unspecified chronicity, unspecified whether sciatica is present    Referring Provider (PT)  Caryl Pina, MD    Onset Date/Surgical Date  10/03/18    Next MD Visit  n/a    Prior Therapy  no      Precautions   Precautions  None                   [facility] Adult PT Treatment/Exercise - 11/02/18 0001      Lumbar Exercises: Aerobic   Nustep  Level 4 x12 minutes      Modalities   Modalities  Traction;Electrical Stimulation;Moist Heat      Moist Heat Therapy   Number Minutes Moist Heat  15 Minutes    Moist Heat Location  Lumbar Spine      Electrical Stimulation   Electrical Stimulation Location  bilateral low back    Electrical Stimulation Action  IFC    Electrical Stimulation Parameters  80-150 hz x15 min    Electrical Stimulation Goals  Pain      Traction   Type of Traction  Lumbar    Min (lbs)  5  Max (lbs)  70    Hold Time  99    Rest Time  5    Time  15                  PT Long Term Goals - 10/07/18 2017      PT LONG TERM GOAL #1   Title  Patient will be independent with HEP    Time  6    Period  Weeks    Status  New      PT LONG TERM GOAL #2   Title  Patient will demonstrate 4/5 or greater bilateral hip MMT to improve stability during functional tasks.    Time  6    Period  Weeks    Status  New      PT LONG TERM GOAL #3   Title  Patient will report ability to stand for 4+ hours for work activities with low back pain less than or equal to 4/10    Time  4    Period  Weeks    Status  New      PT LONG TERM GOAL #4   Title  Patient will report minimal to no sharp pains in low back while transitioning in/out of bed.    Time  4    Period  Weeks    Status  New            Plan - 11/02/18 1755    Clinical Impression Statement  Patient arrived noting more pain but had taken pain medication so pain was currently under control. Normal modalities response noted following removal of the modalities.  Patient educated to attempt different traction table to ensure better tolerance to machine and then reassess continuing PT based on tolerance. Mechanical lumbar continued at 70# to assess response on different machine.    Personal Factors and Comorbidities  Age    Examination-Activity Limitations  Bend;Carry;Lift;Stand    Stability/Clinical Decision Making  Stable/Uncomplicated    Rehab Potential  Good    PT Frequency  2x / week    PT Duration  6 weeks    PT Treatment/Interventions  ADLs/Self Care Home Management;Cryotherapy;Electrical Stimulation;Moist Heat;Traction;Ultrasound;Neuromuscular re-education;Manual techniques;Therapeutic exercise;Therapeutic activities;Patient/family education;Passive range of motion;Dry needling;Taping;Spinal Manipulations    PT Next Visit Plan  Assess response to traction. Continue or D/C depending on symptoms per high copay.    PT Home Exercise Plan  see patient education section    Consulted and Agree with Plan of Care  Patient       Patient will benefit from skilled therapeutic intervention in order to improve the following deficits and impairments:  Decreased activity tolerance, Decreased strength, Decreased range of motion, Pain, Postural dysfunction  Visit Diagnosis: Chronic midline low back pain, unspecified whether sciatica present  Muscle weakness (generalized)     Problem List Patient Active Problem List   Diagnosis Date Noted  . Elevated blood pressure reading in office without diagnosis of hypertension 03/02/2017    Standley Brooking, PTA 11/02/2018, 6:01 PM  [facility] [address] [place], [place], [address] Phone: [phone]   Fax:  [phone]  Name: Rachel Page MRN: [id number] Date of Birth: [birth year]

## 2018-11-04 ENCOUNTER — Other Ambulatory Visit: Payer: Self-pay

## 2018-11-04 ENCOUNTER — Encounter: Payer: Self-pay | Admitting: Physical Therapy

## 2018-11-04 ENCOUNTER — Ambulatory Visit: Payer: BC Managed Care – PPO | Admitting: Physical Therapy

## 2018-11-04 DIAGNOSIS — M545 Low back pain: Secondary | ICD-10-CM | POA: Diagnosis not present

## 2018-11-04 DIAGNOSIS — G8929 Other chronic pain: Secondary | ICD-10-CM | POA: Diagnosis not present

## 2018-11-04 DIAGNOSIS — M6281 Muscle weakness (generalized): Secondary | ICD-10-CM

## 2018-11-04 NOTE — Therapy (Addendum)
[facility] [place], [place], [address] Phone: [phone]   Fax:  [phone]  Physical Therapy Treatment PHYSICAL THERAPY DISCHARGE SUMMARY  Visits from Start of Care:7  Current functional level related to goals / functional outcomes: See below   Remaining deficits: Goals not met   Education / Equipment: HEP Plan: Patient agrees to discharge.  Patient goals were not met. Patient is being discharged due to lack of progress.  ?????    Gabriela Eves, PT, DPT   Patient Details  Name: Rachel Page MRN: [id number] Date of Birth: 1960-02-24 Referring Provider (PT): Caryl Pina, MD   Encounter Date: 11/04/2018  PT End of Session - 11/04/18 1655    Visit Number  7    Number of Visits  12    Date for PT Re-Evaluation  11/25/18    Authorization Type  FOTO; Progress note every 10th visit    PT Start Time  1600    PT Stop Time  1650    PT Time Calculation (min)  50 min    Activity Tolerance  Patient tolerated treatment well    Behavior During Therapy  [facility] for tasks assessed/performed       Past Medical History:  Diagnosis Date  . Depression   . Melanoma ([place])    Removed place from back    Past Surgical History:  Procedure Laterality Date  . ABDOMINAL HYSTERECTOMY      There were no vitals filed for this visit.  Subjective Assessment - 11/04/18 1648    Subjective  COVID-19 screening performed upon arrival. Patient reports no significant changes in pain. Traction helped but was only temporary.    Pertinent History  Sulfur allergy    Limitations  Standing;Lifting;House hold activities    How long can you sit comfortably?  unlimited    How long can you stand comfortably?  stands for 8 hrs at work but very uncomfortable    How long can you walk comfortably?  unlimited    Diagnostic tests  x-ray: multilevel spondylosis    Patient Stated Goals  decrease pain, less difficulties with work activities,  improve strength    Currently in Pain?  Yes    Pain Score  6     Pain Location  Back    Pain Orientation  Lower    Pain Descriptors / Indicators  Discomfort    Pain Type  Chronic pain    Pain Onset  More than a month ago    Pain Frequency  Constant         [facility] PT Assessment - 11/04/18 0001      Assessment   Medical Diagnosis  Midline low back pain, unspecified chronicity, unspecified whether sciatica is present    Referring Provider (PT)  Caryl Pina, MD    Onset Date/Surgical Date  10/03/18    Next MD Visit  n/a    Prior Therapy  no      Precautions   Precautions  None                   [facility] Adult PT Treatment/Exercise - 11/04/18 0001      Lumbar Exercises: Aerobic   Nustep  Level 4 x12 minutes      Modalities   Modalities  Traction;Electrical Stimulation;Moist Heat      Moist Heat Therapy   Number Minutes Moist Heat  15 Minutes    Moist Heat Location  Lumbar Spine  Acupuncturist Location  bilateral low back    Electrical Stimulation Action  IFC    Electrical Stimulation Parameters  80-150 hz x15 mins    Electrical Stimulation Goals  Pain      Manual Therapy   Manual Therapy  Soft tissue mobilization    Soft tissue mobilization  STW/M to lumbar paraspinals and upper gluteals to decrease tone and trigger points                  PT Long Term Goals - 11/04/18 1656      PT LONG TERM GOAL #1   Title  Patient will be independent with HEP    Time  6    Period  Weeks    Status  Achieved      PT LONG TERM GOAL #2   Title  Patient will demonstrate 4/5 or greater bilateral hip MMT to improve stability during functional tasks.    Period  Weeks    Status  On-going      PT LONG TERM GOAL #3   Title  Patient will report ability to stand for 4+ hours for work activities with low back pain less than or equal to 4/10    Time  4    Period  Weeks    Status  On-going      PT LONG TERM GOAL #4    Title  Patient will report minimal to no sharp pains in low back while transitioning in/out of bed.    Period  Weeks    Status  On-going            Plan - 11/04/18 1655    Clinical Impression Statement  Patient responded fairly well despite reports of ongoing pain. Patient responded well to STW/M with a slight decrease of pain. Patient and PT discussed putting PT on hold for a follow up visit with MD as patient has made minimal progress in pain. Patient reported agreement. Patient provided with handout to order home TENs unit and was provided information and education for proper use and adverse affects. Patient reported understanding. Patient would benefit from skilled physical therapy to address deficits and address patient's goals.    Personal Factors and Comorbidities  Age    Examination-Activity Limitations  Bend;Carry;Lift;Stand    Stability/Clinical Decision Making  Stable/Uncomplicated    Clinical Decision Making  Low    Rehab Potential  Good    PT Frequency  2x / week    PT Duration  6 weeks    PT Treatment/Interventions  ADLs/Self Care Home Management;Cryotherapy;Electrical Stimulation;Moist Heat;Traction;Ultrasound;Neuromuscular re-education;Manual techniques;Therapeutic exercise;Therapeutic activities;Patient/family education;Passive range of motion;Dry needling;Taping;Spinal Manipulations    PT Next Visit Plan  Hold for MD follow up; continue pending MD    PT Home Exercise Plan  see patient education section    Consulted and Agree with Plan of Care  Patient       Patient will benefit from skilled therapeutic intervention in order to improve the following deficits and impairments:  Decreased activity tolerance, Decreased strength, Decreased range of motion, Pain, Postural dysfunction  Visit Diagnosis: Chronic midline low back pain, unspecified whether sciatica present  Muscle weakness (generalized)     Problem List Patient Active Problem List   Diagnosis Date Noted   . Elevated blood pressure reading in office without diagnosis of hypertension 03/02/2017    Gabriela Eves, PT, DPT 11/04/2018, 5:06 PM  [facility] [place], [place], [address]  Phone: [phone]   Fax:  [phone]  Name: Rachel Page MRN: [id number] Date of Birth: 08/04/1960

## 2018-11-05 ENCOUNTER — Telehealth: Payer: Self-pay | Admitting: Family Medicine

## 2018-11-05 NOTE — Telephone Encounter (Signed)
She needs to schedule a visit for this so we can schedule it and report on the fact that it has not improved and do another evaluation, it would be best if she waits until the full 6 weeks after the last visit which was on 10/04/2018 because we are more likely to get the MRI approved if it has been at least 6 weeks since the last visit

## 2018-11-05 NOTE — Telephone Encounter (Signed)
appt made for in - office

## 2018-11-11 ENCOUNTER — Other Ambulatory Visit: Payer: Self-pay

## 2018-11-12 ENCOUNTER — Encounter: Payer: Self-pay | Admitting: Family Medicine

## 2018-11-12 ENCOUNTER — Ambulatory Visit: Payer: BC Managed Care – PPO | Admitting: Family Medicine

## 2018-11-12 VITALS — BP 153/92 | HR 105 | Temp 98.6°F | Ht 64.0 in | Wt 194.6 lb

## 2018-11-12 DIAGNOSIS — M545 Low back pain, unspecified: Secondary | ICD-10-CM

## 2018-11-12 MED ORDER — CYCLOBENZAPRINE HCL 10 MG PO TABS: 10.0000 mg | ORAL_TABLET | Freq: Every evening | ORAL | 1 refills | Status: DC | PRN

## 2018-11-12 NOTE — Progress Notes (Signed)
BP (!) 153/92   Pulse (!) 105   Temp 98.6 F (37 C) (Temporal)   Ht 5\' 4"  (1.626 m)   Wt 194 lb 9.6 oz (88.3 kg)   LMP 05/05/1992   SpO2 96%   BMI 33.40 kg/m    Subjective:   Patient ID: Rachel Page, female    DOB: July 12, 1960, 58 y.o.   MRN: [id number]  HPI: Rachel Page is a 58 y.o. female presenting on 11/12/2018 for Back Pain (seen 9/21 and states it is no better)   HPI Bilateral lower back tenderness, near midline tenderness as well and paraspinal tenderness, knots in muscles on both sides were reported from physical therapy and she has been doing the physical therapy and the muscle relaxer and anti-inflammatory and had no relief since the last time she was seen.  She is still struggling with work and it hurts a lot at work.  She denies any numbness or weakness going down either of her legs.  Relevant past medical, surgical, family and social history reviewed and updated as indicated. Interim medical history since our last visit reviewed. Allergies and medications reviewed and updated.  Review of Systems  Constitutional: Negative for chills and fever.  Respiratory: Negative for chest tightness and shortness of breath.   Musculoskeletal: Positive for back pain and myalgias. Negative for gait problem.  Skin: Negative for rash.  Neurological: Negative for light-headedness and headaches.  Psychiatric/Behavioral: Negative for agitation and behavioral problems.  All other systems reviewed and are negative.   Per HPI unless specifically indicated above   Objective:   BP (!) 153/92   Pulse (!) 105   Temp 98.6 F (37 C) (Temporal)   Ht 5\' 4"  (1.626 m)   Wt 194 lb 9.6 oz (88.3 kg)   LMP 05/05/1992   SpO2 96%   BMI 33.40 kg/m   Wt Readings from Last 3 Encounters:  11/12/18 194 lb 9.6 oz (88.3 kg)  10/04/18 194 lb 9.6 oz (88.3 kg)  03/02/17 185 lb (83.9 kg)    Physical Exam Vitals signs and nursing note reviewed.  Constitutional:      General: She is not  in acute distress.    Appearance: She is well-developed. She is not diaphoretic.  Eyes:     Conjunctiva/sclera: Conjunctivae normal.  Musculoskeletal: Normal range of motion.     Lumbar back: She exhibits tenderness (Bilateral lumbar tenderness). She exhibits normal range of motion, no deformity and no pain.  Skin:    General: Skin is warm and dry.     Findings: No rash.  Neurological:     Mental Status: She is alert and oriented to person, place, and time.     Coordination: Coordination normal.  Psychiatric:        Behavior: Behavior normal.       Assessment & Plan:   Problem List Items Addressed This Visit    None    Visit Diagnoses    Acute midline low back pain without sciatica    -  Primary   Relevant Medications   cyclobenzaprine (FLEXERIL) 10 MG tablet   Other Relevant Orders   MR Lumbar Spine Wo Contrast   Midline low back pain, unspecified chronicity, unspecified whether sciatica present       Relevant Medications   cyclobenzaprine (FLEXERIL) 10 MG tablet   Other Relevant Orders   MR Lumbar Spine Wo Contrast    Patient has continued back pain and did physical therapy and has done muscle relaxers  and prednisone over the past 6 weeks since 10/04/2018 when she was last seen with 0 improvement, will order an MRI  Follow up plan: Return if symptoms worsen or fail to improve.  Counseling provided for all of the vaccine components Orders Placed This Encounter  Procedures  . MR Lumbar Spine Wo Contrast    Caryl Pina, MD [facility] 11/12/2018, 10:40 AM

## 2018-11-26 ENCOUNTER — Ambulatory Visit (HOSPITAL_COMMUNITY): Payer: BC Managed Care – PPO

## 2018-11-29 ENCOUNTER — Ambulatory Visit (HOSPITAL_COMMUNITY)
Admission: RE | Admit: 2018-11-29 | Discharge: 2018-11-29 | Disposition: A | Discharge status: Home / Self Care | Payer: BC Managed Care – PPO | Source: Ambulatory Visit | Attending: Family Medicine | Admitting: Family Medicine

## 2018-11-29 ENCOUNTER — Telehealth: Payer: Self-pay | Admitting: Family Medicine

## 2018-11-29 ENCOUNTER — Other Ambulatory Visit: Payer: Self-pay

## 2018-11-29 DIAGNOSIS — M545 Low back pain, unspecified: Secondary | ICD-10-CM

## 2018-11-29 DIAGNOSIS — M5136 Other intervertebral disc degeneration, lumbar region: Secondary | ICD-10-CM

## 2018-11-29 NOTE — Telephone Encounter (Signed)
Placed referral for the patient to neurosurgery

## 2018-11-29 NOTE — Telephone Encounter (Signed)
-----   Message from Zannie Cove, LPN sent at [id number]  3:47 PM EST ----- Pt is wanting the Neurosurgery referral . We discussed [facility] in [address]  She works until 3 pm - please LM on VM if doesn't answer - call [phone]

## 2018-12-06 DIAGNOSIS — M545 Low back pain: Secondary | ICD-10-CM | POA: Diagnosis not present

## 2018-12-06 DIAGNOSIS — G8929 Other chronic pain: Secondary | ICD-10-CM | POA: Diagnosis not present

## 2018-12-06 DIAGNOSIS — M47816 Spondylosis without myelopathy or radiculopathy, lumbar region: Secondary | ICD-10-CM | POA: Diagnosis not present

## 2019-02-17 DIAGNOSIS — Z85820 Personal history of malignant melanoma of skin: Secondary | ICD-10-CM | POA: Diagnosis not present

## 2019-02-17 DIAGNOSIS — L821 Other seborrheic keratosis: Secondary | ICD-10-CM | POA: Diagnosis not present

## 2019-02-17 DIAGNOSIS — Z08 Encounter for follow-up examination after completed treatment for malignant neoplasm: Secondary | ICD-10-CM | POA: Diagnosis not present

## 2019-02-17 DIAGNOSIS — Z1283 Encounter for screening for malignant neoplasm of skin: Secondary | ICD-10-CM | POA: Diagnosis not present

## 2019-02-25 DIAGNOSIS — H5213 Myopia, bilateral: Secondary | ICD-10-CM | POA: Diagnosis not present

## 2019-02-25 DIAGNOSIS — H04123 Dry eye syndrome of bilateral lacrimal glands: Secondary | ICD-10-CM | POA: Diagnosis not present

## 2019-02-25 DIAGNOSIS — H40033 Anatomical narrow angle, bilateral: Secondary | ICD-10-CM | POA: Diagnosis not present

## 2019-05-23 DIAGNOSIS — Z1231 Encounter for screening mammogram for malignant neoplasm of breast: Secondary | ICD-10-CM | POA: Diagnosis not present

## 2019-06-17 DIAGNOSIS — Z23 Encounter for immunization: Secondary | ICD-10-CM | POA: Diagnosis not present

## 2019-07-08 DIAGNOSIS — Z23 Encounter for immunization: Secondary | ICD-10-CM | POA: Diagnosis not present

## 2019-09-12 DIAGNOSIS — Z01419 Encounter for gynecological examination (general) (routine) without abnormal findings: Secondary | ICD-10-CM | POA: Diagnosis not present

## 2019-09-12 DIAGNOSIS — N952 Postmenopausal atrophic vaginitis: Secondary | ICD-10-CM | POA: Diagnosis not present

## 2019-09-12 DIAGNOSIS — Z90710 Acquired absence of both cervix and uterus: Secondary | ICD-10-CM | POA: Diagnosis not present

## 2019-09-12 DIAGNOSIS — Z6832 Body mass index (BMI) 32.0-32.9, adult: Secondary | ICD-10-CM | POA: Diagnosis not present

## 2019-11-08 DIAGNOSIS — M722 Plantar fascial fibromatosis: Secondary | ICD-10-CM | POA: Diagnosis not present

## 2019-11-08 DIAGNOSIS — M79672 Pain in left foot: Secondary | ICD-10-CM | POA: Diagnosis not present

## 2019-12-01 DIAGNOSIS — M722 Plantar fascial fibromatosis: Secondary | ICD-10-CM | POA: Diagnosis not present

## 2019-12-01 DIAGNOSIS — M79672 Pain in left foot: Secondary | ICD-10-CM | POA: Diagnosis not present

## 2020-01-03 DIAGNOSIS — M722 Plantar fascial fibromatosis: Secondary | ICD-10-CM | POA: Diagnosis not present

## 2020-01-03 DIAGNOSIS — M79672 Pain in left foot: Secondary | ICD-10-CM | POA: Diagnosis not present

## 2020-01-31 DIAGNOSIS — M79672 Pain in left foot: Secondary | ICD-10-CM | POA: Diagnosis not present

## 2020-01-31 DIAGNOSIS — M722 Plantar fascial fibromatosis: Secondary | ICD-10-CM | POA: Diagnosis not present

## 2020-02-20 DIAGNOSIS — Z1211 Encounter for screening for malignant neoplasm of colon: Secondary | ICD-10-CM | POA: Diagnosis not present

## 2020-02-20 DIAGNOSIS — Z6832 Body mass index (BMI) 32.0-32.9, adult: Secondary | ICD-10-CM | POA: Diagnosis not present

## 2020-03-15 DIAGNOSIS — D225 Melanocytic nevi of trunk: Secondary | ICD-10-CM | POA: Diagnosis not present

## 2020-03-15 DIAGNOSIS — Z85820 Personal history of malignant melanoma of skin: Secondary | ICD-10-CM | POA: Diagnosis not present

## 2020-03-15 DIAGNOSIS — Z08 Encounter for follow-up examination after completed treatment for malignant neoplasm: Secondary | ICD-10-CM | POA: Diagnosis not present

## 2020-03-15 DIAGNOSIS — Z1283 Encounter for screening for malignant neoplasm of skin: Secondary | ICD-10-CM | POA: Diagnosis not present

## 2020-03-19 DIAGNOSIS — Z01818 Encounter for other preprocedural examination: Secondary | ICD-10-CM | POA: Diagnosis not present

## 2020-03-22 DIAGNOSIS — Z882 Allergy status to sulfonamides status: Secondary | ICD-10-CM | POA: Diagnosis not present

## 2020-03-22 DIAGNOSIS — K640 First degree hemorrhoids: Secondary | ICD-10-CM | POA: Diagnosis not present

## 2020-03-22 DIAGNOSIS — Z880 Allergy status to penicillin: Secondary | ICD-10-CM | POA: Diagnosis not present

## 2020-03-22 DIAGNOSIS — K644 Residual hemorrhoidal skin tags: Secondary | ICD-10-CM | POA: Diagnosis not present

## 2020-03-22 DIAGNOSIS — Z1211 Encounter for screening for malignant neoplasm of colon: Secondary | ICD-10-CM | POA: Diagnosis not present

## 2020-03-22 DIAGNOSIS — D1779 Benign lipomatous neoplasm of other sites: Secondary | ICD-10-CM | POA: Diagnosis not present

## 2020-03-22 DIAGNOSIS — D175 Benign lipomatous neoplasm of intra-abdominal organs: Secondary | ICD-10-CM | POA: Diagnosis not present

## 2020-04-11 DIAGNOSIS — D175 Benign lipomatous neoplasm of intra-abdominal organs: Secondary | ICD-10-CM | POA: Diagnosis not present

## 2020-04-11 DIAGNOSIS — Z6831 Body mass index (BMI) 31.0-31.9, adult: Secondary | ICD-10-CM | POA: Diagnosis not present

## 2020-04-17 ENCOUNTER — Encounter: Payer: Self-pay | Admitting: Family Medicine

## 2020-04-17 ENCOUNTER — Ambulatory Visit: Payer: BC Managed Care – PPO | Admitting: Family Medicine

## 2020-04-17 DIAGNOSIS — R062 Wheezing: Secondary | ICD-10-CM | POA: Diagnosis not present

## 2020-04-17 DIAGNOSIS — J069 Acute upper respiratory infection, unspecified: Secondary | ICD-10-CM

## 2020-04-17 DIAGNOSIS — R059 Cough, unspecified: Secondary | ICD-10-CM | POA: Diagnosis not present

## 2020-04-17 MED ORDER — METHYLPREDNISOLONE 4 MG PO TBPK: ORAL_TABLET | ORAL | 0 refills | Status: DC

## 2020-04-17 MED ORDER — ALBUTEROL SULFATE HFA 108 (90 BASE) MCG/ACT IN AERS: 2.0000 | INHALATION_SPRAY | Freq: Four times a day (QID) | RESPIRATORY_TRACT | 0 refills | Status: DC | PRN

## 2020-04-17 NOTE — Addendum Note (Signed)
Addended by: Collier Bullock on: 04/17/2020 04:32 PM   Modules accepted: Orders

## 2020-04-17 NOTE — Progress Notes (Signed)
Virtual Visit via Telephone Note  I connected with Rachel Page on 04/17/20 at 4:01 PM by telephone and verified that I am speaking with the correct person using two identifiers. Rachel Page is currently located in her vehicle and nobody is currently with her during this visit. The provider, Loman Brooklyn, FNP is located in their office at time of visit.  I discussed the limitations, risks, security and privacy concerns of performing an evaluation and management service by telephone and the availability of in person appointments. I also discussed with the patient that there may be a patient responsible charge related to this service. The patient expressed understanding and agreed to proceed.  Subjective: PCP: Dettinger, Fransisca Kaufmann, MD  Chief Complaint  Patient presents with  . URI   Patient complains of cough, chest congestion, headache, runny nose, fever, postnasal drainage and wheezing. Max temp 101.2. Onset of symptoms was 2 days ago, unchanged since that time. She is drinking plenty of fluids. Evaluation to date: negative at home COVID test. Treatment to date: Mucinex-D and Benadryl. She does not smoke.   mucinex-d Benadryl  ROS: Per HPI  Current Outpatient Medications:  .  aspirin 81 MG tablet, Take 81 mg by mouth daily., Disp: , Rfl:  .  conjugated estrogens (PREMARIN) vaginal cream, Place 1 Applicatorful vaginally daily., Disp: , Rfl:  .  cyclobenzaprine (FLEXERIL) 10 MG tablet, Take 1 tablet (10 mg total) by mouth at bedtime as needed for muscle spasms., Disp: 30 tablet, Rfl: 1 .  fish oil-omega-3 fatty acids 1000 MG capsule, Take 2 g by mouth daily., Disp: , Rfl:  .  Multiple Vitamin (MULTIVITAMIN) tablet, Take 1 tablet by mouth daily., Disp: , Rfl:  .  Red Yeast Rice Extract (RED YEAST RICE PO), Take by mouth., Disp: , Rfl:   Allergies  Allergen Reactions  . Penicillins   . Sulfa Antibiotics    Past Medical History:  Diagnosis Date  . Depression   . Melanoma  ([place])    Removed place from back    Observations/Objective: A&O  No respiratory distress or wheezing audible over the phone Mood, judgement, and thought processes all WNL  Assessment and Plan: 1. Viral URI Discussed symptom management and normal course of viral illnesses. - methylPREDNISolone (MEDROL DOSEPAK) 4 MG TBPK tablet; Use as directed.  Dispense: 21 each; Refill: 0  2. Cough - Novel Coronavirus, NAA ([facility]); Future  3. Wheezing Discussed how to use an albuterol inhaler. - albuterol (VENTOLIN HFA) 108 (90 Base) MCG/ACT inhaler; Inhale 2 puffs into the lungs every 6 (six) hours as needed for wheezing.  Dispense: 18 g; Refill: 0   Follow Up Instructions:  I discussed the assessment and treatment plan with the patient. The patient was provided an opportunity to ask questions and all were answered. The patient agreed with the plan and demonstrated an understanding of the instructions.   The patient was advised to call back or seek an in-person evaluation if the symptoms worsen or if the condition fails to improve as anticipated.  The above assessment and management plan was discussed with the patient. The patient verbalized understanding of and has agreed to the management plan. Patient is aware to call the clinic if symptoms persist or worsen. Patient is aware when to return to the clinic for a follow-up visit. Patient educated on when it is appropriate to go to the emergency department.   Time call ended: 4:12 PM  I provided 11 minutes of non-face-to-face time  during this encounter.  Hendricks Limes, MSN, APRN, FNP-C [facility] 04/17/20

## 2020-04-18 LAB — NOVEL CORONAVIRUS, NAA: SARS-CoV-2, NAA: NOT DETECTED

## 2020-04-18 LAB — SARS-COV-2, NAA 2 DAY TAT

## 2020-05-25 DIAGNOSIS — H5210 Myopia, unspecified eye: Secondary | ICD-10-CM | POA: Diagnosis not present

## 2020-05-29 DIAGNOSIS — Z1231 Encounter for screening mammogram for malignant neoplasm of breast: Secondary | ICD-10-CM | POA: Diagnosis not present

## 2020-06-25 DIAGNOSIS — H5203 Hypermetropia, bilateral: Secondary | ICD-10-CM | POA: Diagnosis not present

## 2020-09-14 DIAGNOSIS — Z6831 Body mass index (BMI) 31.0-31.9, adult: Secondary | ICD-10-CM | POA: Diagnosis not present

## 2020-09-14 DIAGNOSIS — Z90710 Acquired absence of both cervix and uterus: Secondary | ICD-10-CM | POA: Diagnosis not present

## 2020-09-14 DIAGNOSIS — Z01419 Encounter for gynecological examination (general) (routine) without abnormal findings: Secondary | ICD-10-CM | POA: Diagnosis not present

## 2020-09-14 DIAGNOSIS — N952 Postmenopausal atrophic vaginitis: Secondary | ICD-10-CM | POA: Diagnosis not present

## 2020-10-01 ENCOUNTER — Ambulatory Visit: Payer: BC Managed Care – PPO | Admitting: Nurse Practitioner

## 2020-10-01 VITALS — BP 133/76 | HR 85 | Temp 98.1°F

## 2020-10-01 DIAGNOSIS — R197 Diarrhea, unspecified: Secondary | ICD-10-CM | POA: Diagnosis not present

## 2020-10-01 DIAGNOSIS — R059 Cough, unspecified: Secondary | ICD-10-CM | POA: Diagnosis not present

## 2020-10-01 MED ORDER — BENZONATATE 100 MG PO CAPS: 100.0000 mg | ORAL_CAPSULE | Freq: Three times a day (TID) | ORAL | 0 refills | Status: DC | PRN

## 2020-10-01 MED ORDER — DM-GUAIFENESIN ER 30-600 MG PO TB12: 1.0000 | ORAL_TABLET | Freq: Two times a day (BID) | ORAL | 0 refills | Status: DC

## 2020-10-01 MED ORDER — ACETAMINOPHEN 500 MG PO TABS: 500.0000 mg | ORAL_TABLET | Freq: Four times a day (QID) | ORAL | 0 refills | Status: AC | PRN

## 2020-10-01 NOTE — Progress Notes (Signed)
Acute Office Visit  Subjective:    Patient ID: Rachel Page, female    DOB: [birth year], 60 y.o.   MRN: [id number]  Chief Complaint  Patient presents with   Cough   Headache   Diarrhea    Diarrhea  This is a new problem. The current episode started yesterday. The problem occurs 2 to 4 times per day. The problem has been gradually improving. The stool consistency is described as Watery. Associated symptoms include coughing. Pertinent negatives include no chills or headaches. Nothing aggravates the symptoms. She has tried nothing for the symptoms.  Cough This is a new problem. The current episode started yesterday. The problem has been unchanged. The problem occurs constantly. The cough is Non-productive. Pertinent negatives include no chills, ear congestion or headaches.    Past Medical History:  Diagnosis Date   Depression    Melanoma ([place])    Removed place from back    Past Surgical History:  Procedure Laterality Date   ABDOMINAL HYSTERECTOMY      Family History  Problem Relation Age of Onset   Arthritis Mother    COPD Father    Cancer Father     Social History   Socioeconomic History   Marital status: Married    Spouse name: Not on file   Number of children: Not on file   Years of education: Not on file   Highest education level: Not on file  Occupational History   Not on file  Tobacco Use   Smoking status: Never   Smokeless tobacco: Never  Vaping Use   Vaping Use: Never used  Substance and Sexual Activity   Alcohol use: No   Drug use: No   Sexual activity: Yes  Other Topics Concern   Not on file  Social History Narrative   Not on file   Social Determinants of Health   Financial Resource Strain: Not on file  Food Insecurity: Not on file  Transportation Needs: Not on file  Physical Activity: Not on file  Stress: Not on file  Social Connections: Not on file  Intimate Partner Violence: Not on file    Outpatient Medications Prior to Visit   Medication Sig Dispense Refill   fish oil-omega-3 fatty acids 1000 MG capsule Take 2 g by mouth daily.     Multiple Vitamin (MULTIVITAMIN) tablet Take 1 tablet by mouth daily.     Red Yeast Rice Extract (RED YEAST RICE PO) Take by mouth.     albuterol (VENTOLIN HFA) 108 (90 Base) MCG/ACT inhaler Inhale 2 puffs into the lungs every 6 (six) hours as needed for wheezing. 18 g 0   aspirin 81 MG tablet Take 81 mg by mouth daily.     conjugated estrogens (PREMARIN) vaginal cream Place 1 Applicatorful vaginally daily.     cyclobenzaprine (FLEXERIL) 10 MG tablet Take 1 tablet (10 mg total) by mouth at bedtime as needed for muscle spasms. 30 tablet 1   methylPREDNISolone (MEDROL DOSEPAK) 4 MG TBPK tablet Use as directed. 21 each 0   No facility-administered medications prior to visit.    Allergies  Allergen Reactions   Penicillins    Sulfa Antibiotics     Review of Systems  Constitutional:  Negative for chills.  HENT: Negative.    Respiratory:  Positive for cough.   Gastrointestinal:  Positive for diarrhea.  Neurological:  Negative for headaches.  All other systems reviewed and are negative.     Objective:    Physical Exam Vitals and  nursing note reviewed.  Constitutional:      Appearance: Normal appearance. She is well-developed.  HENT:     Head: Normocephalic.     Nose: Nose normal.     Mouth/Throat:     Mouth: Mucous membranes are moist.     Pharynx: Oropharynx is clear.  Eyes:     Conjunctiva/sclera: Conjunctivae normal.  Cardiovascular:     Rate and Rhythm: Normal rate and regular rhythm.  Pulmonary:     Effort: Pulmonary effort is normal.     Breath sounds: Normal breath sounds.  Abdominal:     General: Bowel sounds are normal. There is no distension.     Palpations: Abdomen is soft.     Tenderness: There is no abdominal tenderness.  Skin:    General: Skin is warm.     Findings: No rash.  Neurological:     Mental Status: She is alert and oriented to person,  place, and time.  Psychiatric:        Behavior: Behavior normal.    BP 133/76   Pulse 85   Temp 98.1 F (36.7 C) (Temporal)   LMP 05/05/1992   SpO2 95%  Wt Readings from Last 3 Encounters:  11/12/18 194 lb 9.6 oz (88.3 kg)  10/04/18 194 lb 9.6 oz (88.3 kg)  03/02/17 185 lb (83.9 kg)    Health Maintenance Due  Topic Date Due   COVID-19 Vaccine (1) Never done   HIV Screening  Never done   Hepatitis C Screening  Never done   TETANUS/TDAP  Never done   PAP SMEAR-Modifier  Never done   COLONOSCOPY (Pts 45-24yrs Insurance coverage will need to be confirmed)  Never done   Zoster Vaccines- Shingrix (1 of 2) Never done   INFLUENZA VACCINE  08/13/2020    There are no preventive care reminders to display for this patient.   No results found for: TSH No results found for: WBC, HGB, HCT, MCV, PLT No results found for: NA, K, CHLORIDE, CO2, GLUCOSE, BUN, CREATININE, BILITOT, ALKPHOS, AST, ALT, PROT, ALBUMIN, CALCIUM, ANIONGAP, EGFR, GFR No results found for: CHOL No results found for: HDL No results found for: LDLCALC No results found for: TRIG No results found for: CHOLHDL No results found for: HGBA1C     Assessment & Plan:   Problem List Items Addressed This Visit       Other   Diarrhea    Completed COVID-19 swab results pending.  Brat diet, Imodium over-the-counter for diarrhea, increase hydration follow-up with worsening or unresolved symptoms.      Relevant Orders   Novel Coronavirus, NAA ([facility]) (Completed)   Cough - Primary    Completed COVID-19 swab results pending, benzonatate and guaifenesin for cough and congestion, education provided to patient printed handouts given.  Follow-up with worsening unresolved symptoms.  Rx sent to pharmacy      Relevant Medications   benzonatate (TESSALON PERLES) 100 MG capsule   dextromethorphan-guaiFENesin (MUCINEX DM) 30-600 MG 12hr tablet   acetaminophen (TYLENOL) 500 MG tablet   Other Relevant Orders   Novel  Coronavirus, NAA ([facility]) (Completed)     Meds ordered this encounter  Medications   benzonatate (TESSALON PERLES) 100 MG capsule    Sig: Take 1 capsule (100 mg total) by mouth 3 (three) times daily as needed for cough.    Dispense:  20 capsule    Refill:  0    Order Specific Question:   Supervising Provider    Answer:   Ronnie Doss  M [[id number]]   dextromethorphan-guaiFENesin (MUCINEX DM) 30-600 MG 12hr tablet    Sig: Take 1 tablet by mouth 2 (two) times daily.    Dispense:  30 tablet    Refill:  0    Order Specific Question:   Supervising Provider    Answer:   Janora Norlander [[id number]]   acetaminophen (TYLENOL) 500 MG tablet    Sig: Take 1 tablet (500 mg total) by mouth every 6 (six) hours as needed.    Dispense:  30 tablet    Refill:  0    Order Specific Question:   Supervising Provider    Answer:   Janora Norlander [[id number]]     Ivy Lynn, NP

## 2020-10-01 NOTE — Patient Instructions (Signed)
10 Things You Can Do to Manage Your COVID-19 Symptoms at Home If you have possible or confirmed COVID-19 Stay home except to get medical care. Monitor your symptoms carefully. If your symptoms get worse, call your healthcare provider immediately. Get rest and stay hydrated. If you have a medical appointment, call the healthcare provider ahead of time and tell them that you have or may have COVID-19. For medical emergencies, call 911 and notify the dispatch personnel that you have or may have COVID-19. Cover your cough and sneezes with a tissue or use the inside of your elbow. Wash your hands often with soap and water for at least 20 seconds or clean your hands with an alcohol-based hand sanitizer that contains at least 60% alcohol. As much as possible, stay in a specific room and away from other people in your home. Also, you should use a separate bathroom, if available. If you need to be around other people in or outside of the home, wear a mask. Avoid sharing personal items with other people in your household, like dishes, towels, and bedding. Clean all surfaces that are touched often, like counters, tabletops, and doorknobs. Use household cleaning sprays or wipes according to the label instructions. cdc.gov/coronavirus 07/29/2019 This information is not intended to replace advice given to you by your health care provider. Make sure you discuss any questions you have with your health care provider. Document Revised: 05/17/2020 Document Reviewed: 05/17/2020 Elsevier Patient Education  2022 Elsevier Inc.  

## 2020-10-02 ENCOUNTER — Telehealth: Payer: Self-pay | Admitting: Family Medicine

## 2020-10-02 LAB — SARS-COV-2, NAA 2 DAY TAT

## 2020-10-02 LAB — NOVEL CORONAVIRUS, NAA: SARS-CoV-2, NAA: DETECTED — AB

## 2020-10-03 ENCOUNTER — Encounter: Payer: Self-pay | Admitting: Nurse Practitioner

## 2020-10-03 ENCOUNTER — Other Ambulatory Visit: Payer: Self-pay | Admitting: Family Medicine

## 2020-10-03 DIAGNOSIS — R059 Cough, unspecified: Secondary | ICD-10-CM | POA: Insufficient documentation

## 2020-10-03 DIAGNOSIS — R197 Diarrhea, unspecified: Secondary | ICD-10-CM | POA: Insufficient documentation

## 2020-10-03 MED ORDER — MOLNUPIRAVIR EUA 200MG CAPSULE: 4.0000 | ORAL_CAPSULE | Freq: Two times a day (BID) | ORAL | 0 refills | Status: AC

## 2020-10-03 NOTE — Telephone Encounter (Signed)
Je pt. Please review and make pt aware.

## 2020-10-03 NOTE — Telephone Encounter (Signed)
Pt calling back to check again about results and getting a note because she has not been called about either. She stated that she was also told that something else would be called in if she tested positive for covid. Please call back and advise.

## 2020-10-03 NOTE — Assessment & Plan Note (Signed)
Completed COVID-19 swab results pending, benzonatate and guaifenesin for cough and congestion, education provided to patient printed handouts given.  Follow-up with worsening unresolved symptoms.  Rx sent to pharmacy

## 2020-10-03 NOTE — Assessment & Plan Note (Signed)
Completed COVID-19 swab results pending.  Brat diet, Imodium over-the-counter for diarrhea, increase hydration follow-up with worsening or unresolved symptoms.

## 2020-10-03 NOTE — Telephone Encounter (Signed)
Spoke with pt. Dr. Warrick Parisian called in Medication for Covid and I will write work letter on Friday for pt.

## 2020-10-03 NOTE — Telephone Encounter (Signed)
Pt's work is waiting for her results and she will need a note for work. She had a covid test on 9/19 - results are back but have not been reviewed. Needs them by 3pm today. Please call back.

## 2020-10-03 NOTE — Progress Notes (Signed)
mol

## 2020-10-05 ENCOUNTER — Other Ambulatory Visit: Payer: Self-pay

## 2020-10-05 NOTE — Telephone Encounter (Signed)
Pt needs letter for work updated.  She was seen in office 10/01/2020 and go back to work on 10/07/2020 Please call back when done

## 2021-04-11 DIAGNOSIS — D225 Melanocytic nevi of trunk: Secondary | ICD-10-CM | POA: Diagnosis not present

## 2021-04-11 DIAGNOSIS — Z08 Encounter for follow-up examination after completed treatment for malignant neoplasm: Secondary | ICD-10-CM | POA: Diagnosis not present

## 2021-04-11 DIAGNOSIS — Z1283 Encounter for screening for malignant neoplasm of skin: Secondary | ICD-10-CM | POA: Diagnosis not present

## 2021-04-11 DIAGNOSIS — Z85820 Personal history of malignant melanoma of skin: Secondary | ICD-10-CM | POA: Diagnosis not present

## 2021-05-21 DIAGNOSIS — H5213 Myopia, bilateral: Secondary | ICD-10-CM | POA: Diagnosis not present

## 2021-05-22 DIAGNOSIS — H5213 Myopia, bilateral: Secondary | ICD-10-CM | POA: Diagnosis not present

## 2021-06-06 DIAGNOSIS — Z1231 Encounter for screening mammogram for malignant neoplasm of breast: Secondary | ICD-10-CM | POA: Diagnosis not present

## 2021-06-06 LAB — HM MAMMOGRAPHY

## 2021-09-13 ENCOUNTER — Ambulatory Visit: Payer: BC Managed Care – PPO | Admitting: Family

## 2021-09-13 ENCOUNTER — Encounter: Payer: Self-pay | Admitting: Family

## 2021-09-13 VITALS — BP 132/80 | HR 68 | Temp 98.2°F | Ht 64.0 in | Wt 185.0 lb

## 2021-09-13 DIAGNOSIS — M545 Low back pain, unspecified: Secondary | ICD-10-CM | POA: Diagnosis not present

## 2021-09-13 DIAGNOSIS — M5441 Lumbago with sciatica, right side: Secondary | ICD-10-CM

## 2021-09-13 LAB — URINALYSIS, COMPLETE
Bilirubin, UA: NEGATIVE
Glucose, UA: NEGATIVE
Ketones, UA: NEGATIVE
Nitrite, UA: NEGATIVE
Protein,UA: NEGATIVE
RBC, UA: NEGATIVE
Specific Gravity, UA: 1.02 (ref 1.005–1.030)
Urobilinogen, Ur: 0.2 mg/dL (ref 0.2–1.0)
pH, UA: 6 (ref 5.0–7.5)

## 2021-09-13 LAB — MICROSCOPIC EXAMINATION
Bacteria, UA: NONE SEEN
Renal Epithel, UA: NONE SEEN /hpf

## 2021-09-13 MED ORDER — BACLOFEN 10 MG PO TABS
10.0000 mg | ORAL_TABLET | Freq: Three times a day (TID) | ORAL | 0 refills | Status: DC
Start: 2021-09-13 — End: 2022-05-07

## 2021-09-13 MED ORDER — DICLOFENAC SODIUM 75 MG PO TBEC: 75.0000 mg | DELAYED_RELEASE_TABLET | Freq: Two times a day (BID) | ORAL | 0 refills | Status: DC

## 2021-09-13 MED ORDER — KETOROLAC TROMETHAMINE 60 MG/2ML IM SOLN
60.0000 mg | Freq: Once | INTRAMUSCULAR | Status: AC
  Administered 2021-09-13: 60 mg via INTRAMUSCULAR

## 2021-09-13 MED ORDER — METHYLPREDNISOLONE ACETATE 80 MG/ML IJ SUSP
80.0000 mg | Freq: Once | INTRAMUSCULAR | Status: AC
  Administered 2021-09-13: 80 mg via INTRAMUSCULAR

## 2021-09-13 NOTE — Patient Instructions (Signed)

## 2021-09-13 NOTE — Progress Notes (Signed)
Subjective:    Patient ID: Rachel Page, female    DOB: July 01, 1960, 61 y.o.   MRN: [id number]  Chief Complaint  Patient presents with   Back Pain    Right side runs down leg to knee sharp pain in the back    Pt presents to the office today with back pain that started Tuesday after pulling weeds.  Back Pain This is a new problem. The current episode started in the past 7 days. The problem occurs intermittently. The problem has been waxing and waning since onset. The pain is present in the lumbar spine. The quality of the pain is described as aching. The pain is at a severity of 8/10. The pain is mild. The symptoms are aggravated by standing. Associated symptoms include leg pain. Risk factors include obesity. She has tried bed rest and NSAIDs for the symptoms. The treatment provided mild relief.      Review of Systems  Musculoskeletal:  Positive for back pain.  All other systems reviewed and are negative.      Objective:   Physical Exam Vitals reviewed.  Constitutional:      General: She is not in acute distress.    Appearance: She is well-developed.  HENT:     Head: Normocephalic and atraumatic.  Eyes:     Pupils: Pupils are equal, round, and reactive to light.  Neck:     Thyroid: No thyromegaly.  Cardiovascular:     Rate and Rhythm: Normal rate and regular rhythm.     Heart sounds: Normal heart sounds. No murmur heard. Pulmonary:     Effort: Pulmonary effort is normal. No respiratory distress.     Breath sounds: Normal breath sounds. No wheezing.  Abdominal:     General: Bowel sounds are normal. There is no distension.     Palpations: Abdomen is soft.     Tenderness: There is no abdominal tenderness.  Musculoskeletal:        General: Tenderness present.     Cervical back: Normal range of motion and neck supple.     Comments: Pain in lumbar with flexion and extension  Skin:    General: Skin is warm and dry.  Neurological:     Mental Status: She is alert and  oriented to person, place, and time.     Cranial Nerves: No cranial nerve deficit.     Deep Tendon Reflexes: Reflexes are normal and symmetric.  Psychiatric:        Behavior: Behavior normal.        Thought Content: Thought content normal.        Judgment: Judgment normal.     BP 132/80   Pulse 68   Temp 98.2 F (36.8 C) (Temporal)   Ht 5\' 4"  (1.626 m)   Wt 185 lb (83.9 kg)   LMP 05/05/1992   BMI 31.76 kg/m      Assessment & Plan:  [name] comes in today with chief complaint of Back Pain (Right side runs down leg to knee sharp pain in the back )   Diagnosis and orders addressed:  1. Acute right-sided low back pain with right-sided sciatica Rest ROM exercises  No other NSAID's while taking diclofenac  Sedation precautions discussed Follow up if symptoms worsens or do not improve  - Urinalysis, Complete - methylPREDNISolone acetate (DEPO-MEDROL) injection 80 mg - ketorolac (TORADOL) injection 60 mg - diclofenac (VOLTAREN) 75 MG EC tablet; Take 1 tablet (75 mg total) by mouth 2 (two) times  daily.  Dispense: 30 tablet; Refill: 0 - baclofen (LIORESAL) 10 MG tablet; Take 1 tablet (10 mg total) by mouth 3 (three) times daily.  Dispense: 30 each; Refill: 0 - [place], FNP

## 2021-09-15 LAB — URINE CULTURE

## 2021-09-25 DIAGNOSIS — R7303 Prediabetes: Secondary | ICD-10-CM | POA: Diagnosis not present

## 2021-09-25 DIAGNOSIS — Z90710 Acquired absence of both cervix and uterus: Secondary | ICD-10-CM | POA: Diagnosis not present

## 2021-09-25 DIAGNOSIS — Z01419 Encounter for gynecological examination (general) (routine) without abnormal findings: Secondary | ICD-10-CM | POA: Diagnosis not present

## 2021-09-25 DIAGNOSIS — N952 Postmenopausal atrophic vaginitis: Secondary | ICD-10-CM | POA: Diagnosis not present

## 2021-10-29 IMAGING — MR MR LUMBAR SPINE W/O CM
4 of 5 series · 13 of 48 positions shown · non-contrast
Comparison: Lumbar radiographs 10/04/2018

CLINICAL DATA: Low back pain right hip pain

EXAM:
MRI LUMBAR SPINE WITHOUT CONTRAST
TECHNIQUE: Multiplanar, multisequence MR imaging of the lumbar spine was
performed. No intravenous contrast was administered.

[Series 3: T2 · sagittal · 4.0mm · 0.41mm/px · 4 of 15 slices shown (1 of 2)]
[im 1/15]
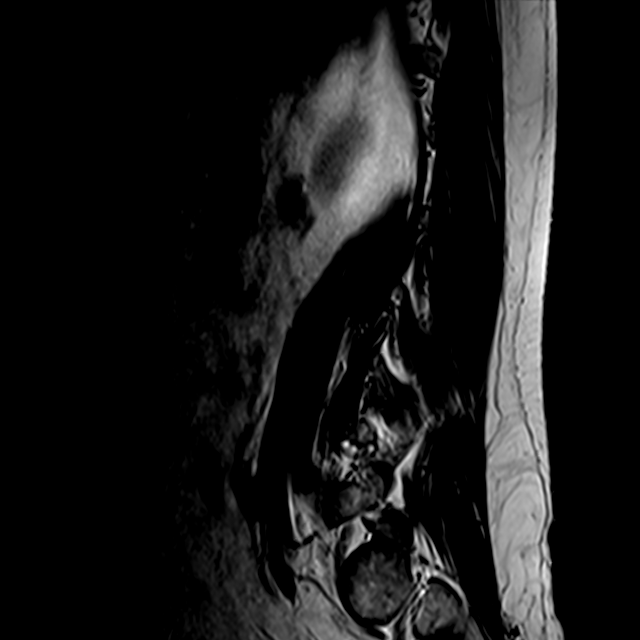
[im 4/15]
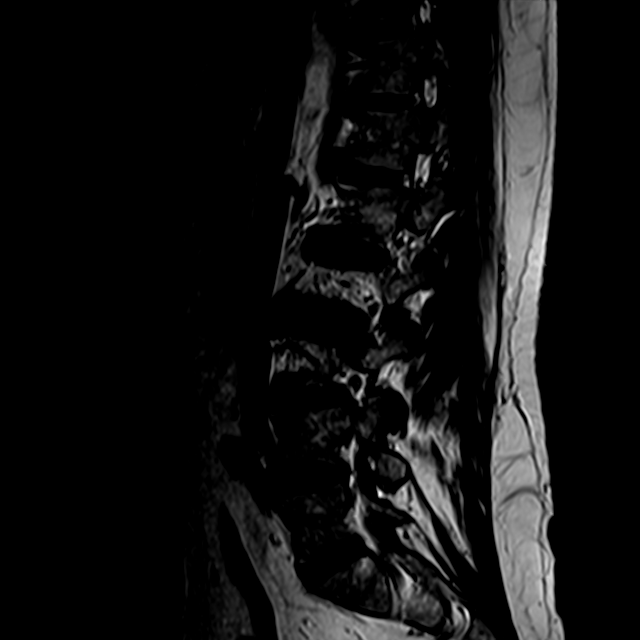
[im 8/15]
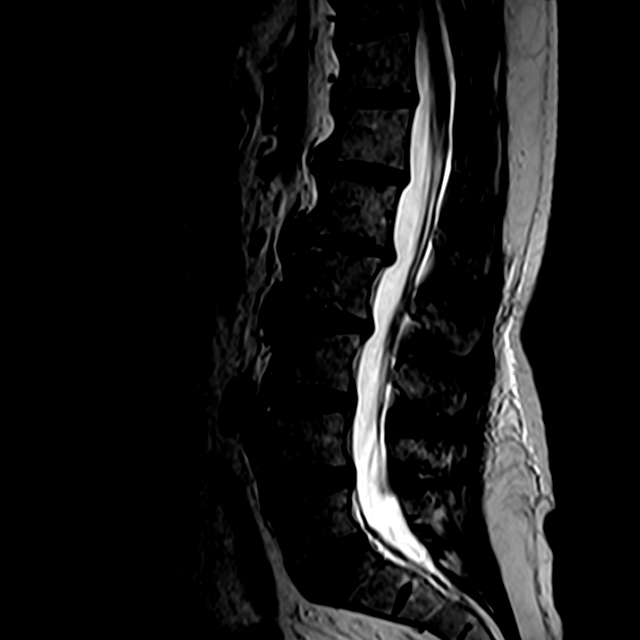
[im 15/15]
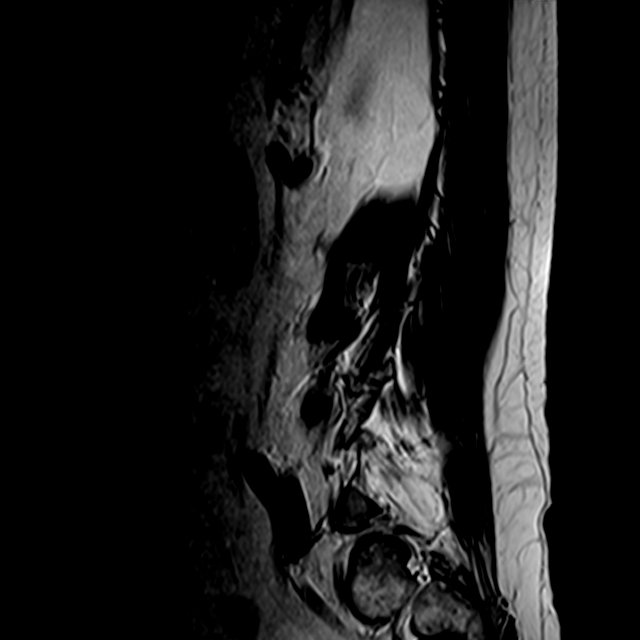

[Series 4: T1 · sagittal · 4.0mm · 0.41mm/px · 3 of 15 slices shown (1 of 2)]
[im 3/15]
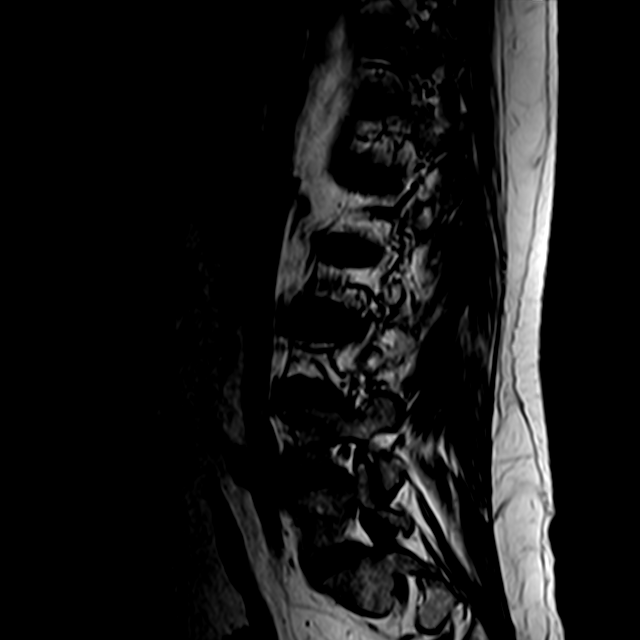
[im 9/15]
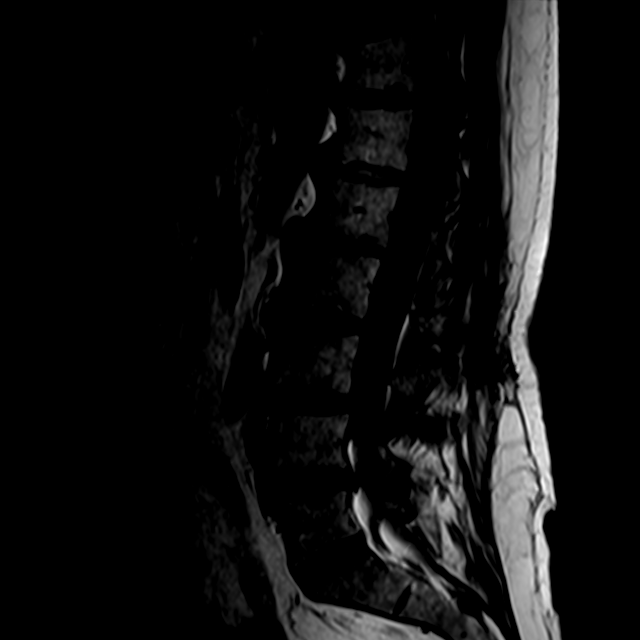
[im 15/15]
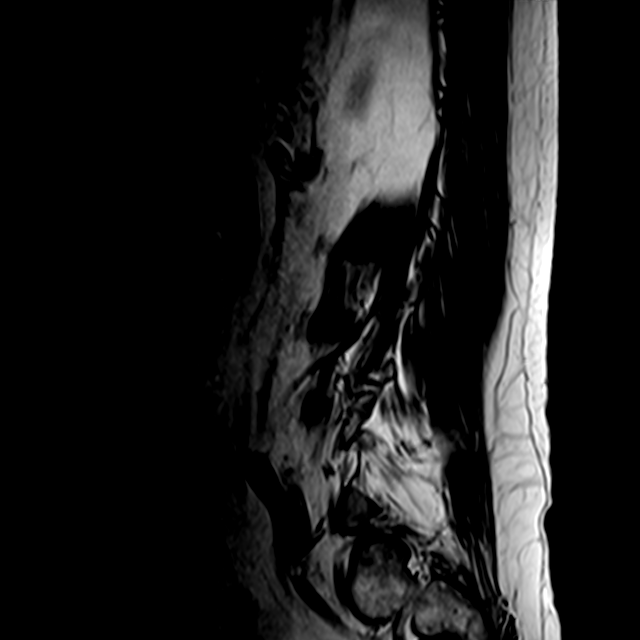

[Series 6: T2 · axial · 4.0mm · 0.23mm/px · z∈[-46,+82]mm · 3 of 37 slices shown (2 of 2)]
[im 6/37]
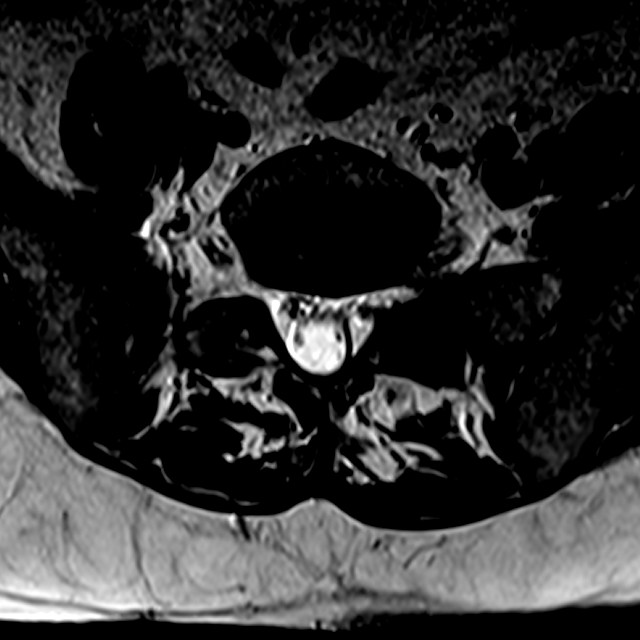
[im 19/37]
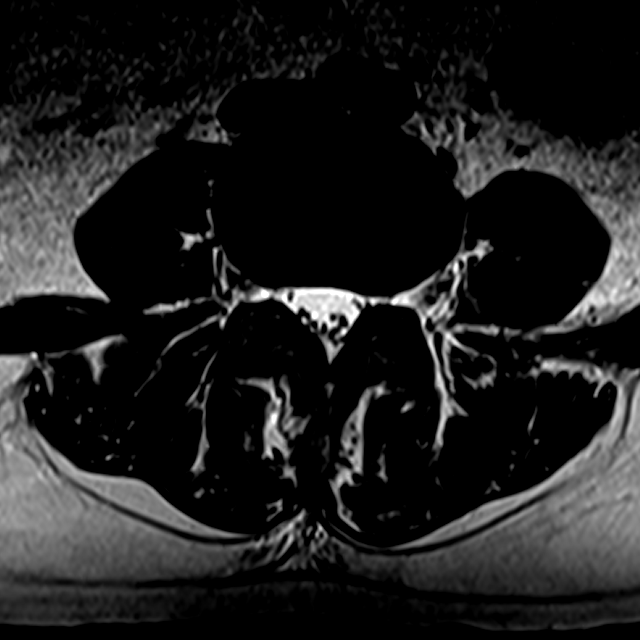
[im 31/37]
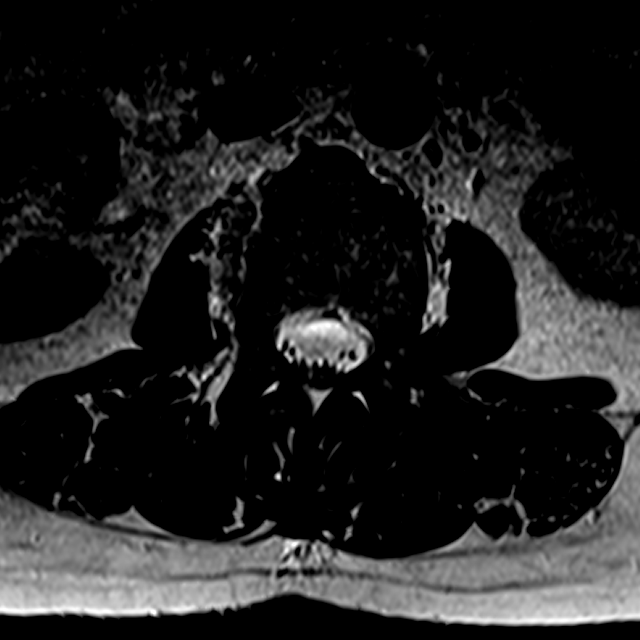

[Series 7: T1 · axial · 4.0mm · 0.23mm/px · z∈[-46,+82]mm · 3 of 37 slices shown (2 of 2)]
[im 6/37]
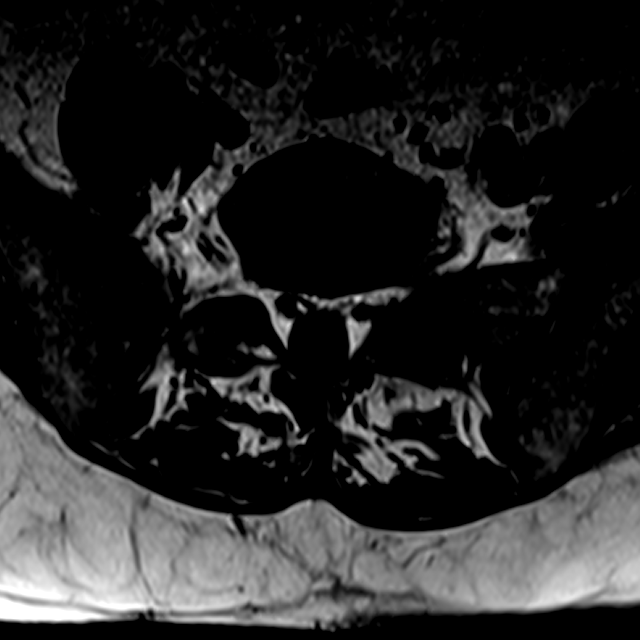
[im 19/37]
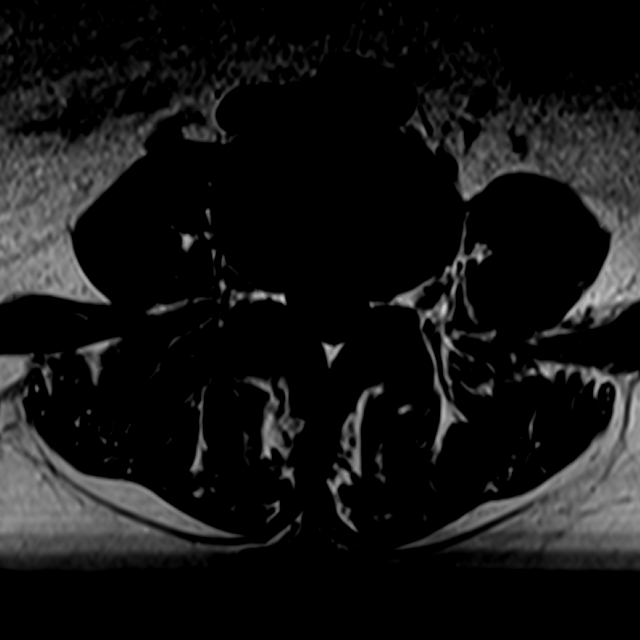
[im 31/37]
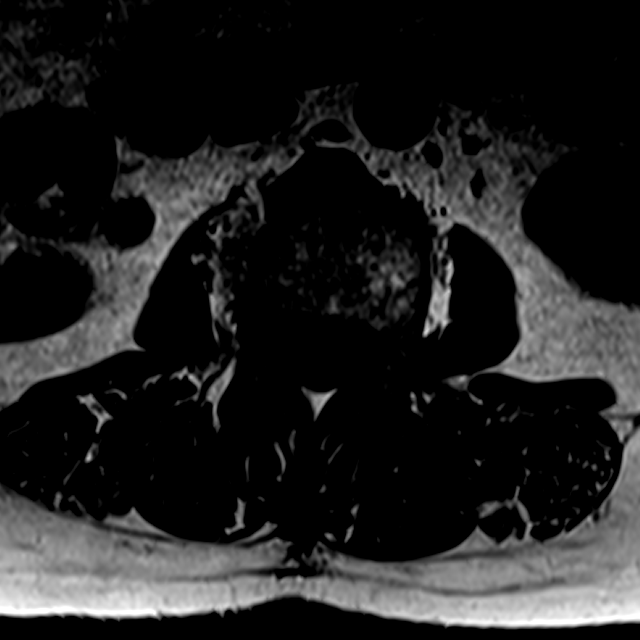

[13 of 48 positions shown; findings below may reference images not displayed]

FINDINGS: Segmentation:  Normal

Alignment:  Mild levoscoliosis.

Mild retrolisthesis T12-L1, L1-2.  Remaining alignment normal

Vertebrae: Negative for fracture or mass. Discogenic edema in the
bone marrow on the right at L2-3

Conus medullaris and cauda equina: Conus extends to the T12-L1
level. Conus and cauda equina appear normal.

Paraspinal and other soft tissues: Negative for paraspinous mass or
edema.

Disc levels:

L1-2: Disc degeneration with diffuse disc bulging and endplate
spurring. Mild subarticular stenosis on the left. Spinal canal
mildly narrowed.

L2-3: Disc degeneration with disc bulging and spurring asymmetric on
the right. Mild subarticular stenosis on the right. Mild facet
degeneration bilaterally.

L3-4: Mild disc and facet degeneration. Negative for disc protrusion
or stenosis

L4-5: Mild disc and facet degeneration. Mild left subarticular
stenosis which could affect the left L5 nerve root in the
subarticular zone.

L5-S1: Mild facet degeneration.  No significant stenosis.
IMPRESSION: Mild lumbar levoscoliosis.

Mild subarticular stenosis on the left at L1-2. Mild subarticular
stenosis on the right L3-4

Mild subarticular stenosis on the left at L4-5 which could affect
the left L5 nerve root.

## 2022-02-12 ENCOUNTER — Encounter: Payer: Self-pay | Admitting: Family Medicine

## 2022-04-04 ENCOUNTER — Encounter: Payer: Self-pay | Admitting: Family Medicine

## 2022-05-07 ENCOUNTER — Ambulatory Visit: Payer: BC Managed Care – PPO | Admitting: Family Medicine

## 2022-05-07 ENCOUNTER — Encounter: Payer: Self-pay | Admitting: Family Medicine

## 2022-05-07 VITALS — BP 131/83 | HR 78 | Temp 98.1°F | Ht 64.0 in | Wt 180.0 lb

## 2022-05-07 DIAGNOSIS — J0100 Acute maxillary sinusitis, unspecified: Secondary | ICD-10-CM

## 2022-05-07 DIAGNOSIS — K1120 Sialoadenitis, unspecified: Secondary | ICD-10-CM

## 2022-05-07 MED ORDER — PSEUDOEPHEDRINE-GUAIFENESIN ER 120-1200 MG PO TB12: 1.0000 | ORAL_TABLET | Freq: Two times a day (BID) | ORAL | 0 refills | Status: AC

## 2022-05-07 MED ORDER — LEVOFLOXACIN 500 MG PO TABS: 500.0000 mg | ORAL_TABLET | Freq: Every day | ORAL | 0 refills | Status: AC

## 2022-05-07 NOTE — Progress Notes (Signed)
Subjective:  Patient ID: Rachel Page, female    DOB: 22-Nov-1960  Age: 62 y.o. MRN: [id number]  CC: Sore Throat, Headache, Sinusitis, and Fatigue   HPI Rachel Page presents for two weeks of sore throat and A. Having big old knots swollen. Also a lot of drainage. Usin alka seltzer cold, "anything sinus" LGF to 100 a couple of times in the last 2 weeks.      05/07/2022    9:25 AM 09/13/2021    3:31 PM 11/12/2018   10:25 AM  Depression screen PHQ 2/9  Decreased Interest 0 0 0  Down, Depressed, Hopeless 0 0 0  PHQ - 2 Score 0 0 0  Altered sleeping  0   Tired, decreased energy  0   Change in appetite  0   Feeling bad or failure about yourself   0   Trouble concentrating  0   Moving slowly or fidgety/restless  0   Suicidal thoughts  0   PHQ-9 Score  0   Difficult doing work/chores  Not difficult at all     [place] has a past medical history of Depression and Melanoma.   She has a past surgical history that includes Abdominal hysterectomy.   Her family history includes Arthritis in her mother; COPD in her father; Cancer in her father.She reports that she has never smoked. She has never used smokeless tobacco. She reports that she does not drink alcohol and does not use drugs.    ROS Review of Systems  Objective:  BP 131/83   Pulse 78   Temp 98.1 F (36.7 C)   Ht  (1.626 m)   Wt 180 lb (81.6 kg)   LMP 05/05/1992   SpO2 96%   BMI 30.90 kg/m   BP Readings from Last 3 Encounters:  05/07/22 131/83  09/13/21 132/80  10/01/20 133/76    Wt Readings from Last 3 Encounters:  05/07/22 180 lb (81.6 kg)  09/13/21 185 lb (83.9 kg)  11/12/18 194 lb 9.6 oz (88.3 kg)     Physical Exam    Assessment & Plan:   There are no diagnoses linked to this encounter.     I have discontinued Rachel Page. Rachel Page's fish oil-omega-3 fatty acids, diclofenac, baclofen, and Omega-3 Fatty Acids (FISH OIL PO). I am also having her maintain her multivitamin, Red  Yeast Rice Extract (RED YEAST RICE PO), acetaminophen, calcium carbonate, and Fish Oil.  Allergies as of 05/07/2022       Reactions   Penicillins    Sulfa Antibiotics         Medication List        Accurate as of May 07, 2022  9:33 AM. If you have any questions, ask your nurse or doctor.          STOP taking these medications    baclofen 10 MG tablet Commonly known as: LIORESAL Stopped by: Mechele Claude, MD   diclofenac 75 MG EC tablet Commonly known as: VOLTAREN Stopped by: Mechele Claude, MD       TAKE these medications    acetaminophen 500 MG tablet Commonly known as: TYLENOL Take 1 tablet (500 mg total) by mouth every 6 (six) hours as needed.   calcium carbonate 1250 (500 Ca) MG tablet Commonly known as: OS-CAL - dosed in mg of elemental calcium Take 1 tablet by mouth.   Fish Oil 1000 MG Caps Take 2 capsules by mouth daily. What changed: Another medication with the same name  was removed. Continue taking this medication, and follow the directions you see here. Changed by: Mechele Claude, MD   multivitamin tablet Take 1 tablet by mouth daily.   RED YEAST RICE PO Take by mouth.         Follow-up: No follow-ups on file.  Mechele Claude, M.D.

## 2022-05-08 DIAGNOSIS — Z85820 Personal history of malignant melanoma of skin: Secondary | ICD-10-CM | POA: Diagnosis not present

## 2022-05-08 DIAGNOSIS — Z08 Encounter for follow-up examination after completed treatment for malignant neoplasm: Secondary | ICD-10-CM | POA: Diagnosis not present

## 2022-05-08 DIAGNOSIS — D225 Melanocytic nevi of trunk: Secondary | ICD-10-CM | POA: Diagnosis not present

## 2022-05-08 DIAGNOSIS — Z1283 Encounter for screening for malignant neoplasm of skin: Secondary | ICD-10-CM | POA: Diagnosis not present

## 2022-06-16 DIAGNOSIS — Z1231 Encounter for screening mammogram for malignant neoplasm of breast: Secondary | ICD-10-CM | POA: Diagnosis not present

## 2022-06-16 LAB — HM MAMMOGRAPHY

## 2022-10-07 DIAGNOSIS — N952 Postmenopausal atrophic vaginitis: Secondary | ICD-10-CM | POA: Diagnosis not present

## 2022-10-07 DIAGNOSIS — Z13820 Encounter for screening for osteoporosis: Secondary | ICD-10-CM | POA: Diagnosis not present

## 2022-10-07 DIAGNOSIS — Z01419 Encounter for gynecological examination (general) (routine) without abnormal findings: Secondary | ICD-10-CM | POA: Diagnosis not present

## 2022-10-07 DIAGNOSIS — Z90710 Acquired absence of both cervix and uterus: Secondary | ICD-10-CM | POA: Diagnosis not present

## 2022-11-07 ENCOUNTER — Encounter: Payer: Self-pay | Admitting: Family Medicine
